# Patient Record
Sex: Female | Born: 1955
Health system: Southern US, Community
[De-identification: ages and names within clinical notes are randomized; demographics above are authoritative.]

## PROBLEM LIST (undated history)

## (undated) DIAGNOSIS — T4145XA Adverse effect of unspecified anesthetic, initial encounter: Secondary | ICD-10-CM

## (undated) DIAGNOSIS — Z87442 Personal history of urinary calculi: Secondary | ICD-10-CM

## (undated) DIAGNOSIS — E739 Lactose intolerance, unspecified: Secondary | ICD-10-CM

## (undated) DIAGNOSIS — T8859XA Other complications of anesthesia, initial encounter: Secondary | ICD-10-CM

## (undated) DIAGNOSIS — R030 Elevated blood-pressure reading, without diagnosis of hypertension: Secondary | ICD-10-CM

## (undated) HISTORY — DX: Lactose intolerance, unspecified: E73.9

## (undated) HISTORY — DX: Elevated blood-pressure reading, without diagnosis of hypertension: R03.0

---
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
## 2007-10-31 ENCOUNTER — Ambulatory Visit (HOSPITAL_COMMUNITY): Admission: RE | Admit: 2007-10-31 | Discharge: 2007-10-31 | Payer: Self-pay | Admitting: Family Medicine

## 2007-11-21 ENCOUNTER — Other Ambulatory Visit: Admission: RE | Admit: 2007-11-21 | Discharge: 2007-11-21 | Payer: Self-pay | Admitting: Obstetrics and Gynecology

## 2007-11-29 ENCOUNTER — Ambulatory Visit (HOSPITAL_COMMUNITY): Admission: RE | Admit: 2007-11-29 | Discharge: 2007-11-29 | Payer: Self-pay | Admitting: Obstetrics & Gynecology

## 2008-12-24 ENCOUNTER — Other Ambulatory Visit: Admission: RE | Admit: 2008-12-24 | Discharge: 2008-12-24 | Payer: Self-pay | Admitting: Obstetrics and Gynecology

## 2010-03-30 ENCOUNTER — Other Ambulatory Visit: Admission: RE | Admit: 2010-03-30 | Discharge: 2010-03-30 | Payer: Self-pay | Admitting: Obstetrics and Gynecology

## 2012-02-29 ENCOUNTER — Other Ambulatory Visit: Payer: Self-pay | Admitting: Adult Health

## 2012-02-29 ENCOUNTER — Other Ambulatory Visit (HOSPITAL_COMMUNITY)
Admission: RE | Admit: 2012-02-29 | Discharge: 2012-02-29 | Disposition: A | Discharge status: Home / Self Care | Payer: PRIVATE HEALTH INSURANCE | Source: Ambulatory Visit | Attending: Obstetrics and Gynecology | Admitting: Obstetrics and Gynecology

## 2012-02-29 DIAGNOSIS — Z1151 Encounter for screening for human papillomavirus (HPV): Secondary | ICD-10-CM | POA: Insufficient documentation

## 2012-02-29 DIAGNOSIS — Z01419 Encounter for gynecological examination (general) (routine) without abnormal findings: Secondary | ICD-10-CM | POA: Insufficient documentation

## 2013-03-19 ENCOUNTER — Ambulatory Visit (INDEPENDENT_AMBULATORY_CARE_PROVIDER_SITE_OTHER): Payer: PRIVATE HEALTH INSURANCE | Admitting: Adult Health

## 2013-03-19 ENCOUNTER — Encounter: Payer: Self-pay | Admitting: Adult Health

## 2013-03-19 VITALS — BP 130/90 | HR 80 | Ht 64.0 in | Wt 189.0 lb

## 2013-03-19 DIAGNOSIS — IMO0001 Reserved for inherently not codable concepts without codable children: Secondary | ICD-10-CM | POA: Insufficient documentation

## 2013-03-19 DIAGNOSIS — Z01419 Encounter for gynecological examination (general) (routine) without abnormal findings: Secondary | ICD-10-CM

## 2013-03-19 DIAGNOSIS — Z1212 Encounter for screening for malignant neoplasm of rectum: Secondary | ICD-10-CM

## 2013-03-19 HISTORY — DX: Reserved for inherently not codable concepts without codable children: IMO0001

## 2013-03-19 LAB — HEMOCCULT GUIAC POC 1CARD (OFFICE): Fecal Occult Blood, POC: NEGATIVE

## 2013-03-19 NOTE — Progress Notes (Signed)
Patient ID: Erica Davis, female   DOB: 1955/09/24, 57 y.o.   MRN: [id number] History of Present Illness: Erica Davis is a 57 year old white female married in for a physical, she has normal pap with negative HPV 02/29/12.She has had a sinus infection this year.Had normal labs earlier this year.   Current Medications, Allergies, Past Medical History, Past Surgical History, Family History and Social History were reviewed in [employer] record.     Review of Systems: Patient denies any headaches, blurred vision, shortness of breath, chest pain, abdominal pain, problems with bowel movements, urination, or intercourse.  No joint pain except in hands or mood swings.    Physical Exam:BP 130/90  Pulse 80  Ht 5\' 4"  (1.626 m)  Wt 189 lb (85.73 kg)  BMI 32.43 kg/m2 General:  Well developed, well nourished, no acute distress Skin:  Warm and dry Neck:  Midline trachea, normal thyroid Lungs; Clear to auscultation bilaterally Breast:  No dominant palpable mass, retraction, or nipple discharge Cardiovascular: Regular rate and rhythm Abdomen:  Soft, non tender, no hepatosplenomegaly Pelvic:  External genitalia is normal in appearance.  The vagina is normal in appearance. The cervix is smooth.  Uterus is felt to be normal size, shape, and contour.  No adnexal masses or tenderness noted. Rectal: Good sphincter tone, no polyps, or hemorrhoids felt.  Hemoccult negative. Extremities:  No swelling or varicosities noted Psych:  No mood changes, alert and cooperative   Impression: Yearly exam no pap Elevated BP    Plan: Physical in 1 year Mammogram yearly Advised colonoscopy  Decrease salt, try to lose 10 lbs, review handout on hypertension

## 2013-03-19 NOTE — Patient Instructions (Signed)

## 2014-03-18 ENCOUNTER — Encounter: Payer: Self-pay | Admitting: Adult Health

## 2014-03-21 ENCOUNTER — Encounter: Payer: Self-pay | Admitting: Adult Health

## 2014-03-21 ENCOUNTER — Ambulatory Visit (INDEPENDENT_AMBULATORY_CARE_PROVIDER_SITE_OTHER): Payer: PRIVATE HEALTH INSURANCE | Admitting: Adult Health

## 2014-03-21 VITALS — BP 120/80 | HR 80 | Ht 65.0 in | Wt 203.0 lb

## 2014-03-21 DIAGNOSIS — Z1212 Encounter for screening for malignant neoplasm of rectum: Secondary | ICD-10-CM

## 2014-03-21 DIAGNOSIS — Z01419 Encounter for gynecological examination (general) (routine) without abnormal findings: Secondary | ICD-10-CM

## 2014-03-21 LAB — HEMOCCULT GUIAC POC 1CARD (OFFICE): Fecal Occult Blood, POC: NEGATIVE

## 2014-03-21 NOTE — Patient Instructions (Signed)
Pap and physical in 1 year Mammogram yearly Colonoscopy advised  Labs with PCP Flu shot advised

## 2014-03-21 NOTE — Progress Notes (Signed)
Patient ID: Erica Davis, female   DOB: 1955-06-17, 58 y.o.   MRN: [id number] History of Present Illness:  Erica Davis is a 58 year old white female,maried, in for gyn physical.No complaints,she had a normal pap with negative HPV 02/29/12.  Current Medications, Allergies, Past Medical History, Past Surgical History, Family History and Social History were reviewed in [employer] record.     Review of Systems: patient denies any headaches, blurred vision, shortness of breath, chest pain, abdominal pain, problems with bowel movements, urination, or intercourse. No joint pain or mood swings.She had labs this year with PCP and all good.    Physical Exam:BP 120/80 mmHg  Pulse 80  Ht 5\' 5"  (1.651 m)  Wt 203 lb (92.08 kg)  BMI 33.78 kg/m2 General:  Well developed, well nourished, no acute distress Skin:  Warm and dry Neck:  Midline trachea, normal thyroid Lungs; Clear to auscultation bilaterally Breast:  No dominant palpable mass, retraction, or nipple discharge Cardiovascular: Regular rate and rhythm Abdomen:  Soft, non tender, no hepatosplenomegaly Pelvic:  External genitalia is normal in appearance for age,no lesions.  The vagina is normal in appearance.  The cervix is smooth.  Uterus is felt to be normal size, shape, and contour.  No                adnexal masses or tenderness noted. Rectal: Good sphincter tone, no polyps, or hemorrhoids felt.  Hemoccult negative. Extremities:  No swelling or varicosities noted Psych:  No mood changes,alert and cooperative,seems happy.   Impression:  Well woman gyn exam   Plan: Pap and physical in 1 year Mammogram yearly Colonoscopy advised Labs with PCP Advised to get flu shot

## 2014-10-23 ENCOUNTER — Other Ambulatory Visit: Payer: Self-pay | Admitting: Orthopedic Surgery

## 2014-10-23 DIAGNOSIS — M25561 Pain in right knee: Secondary | ICD-10-CM

## 2014-10-29 ENCOUNTER — Ambulatory Visit (HOSPITAL_COMMUNITY)
Admission: RE | Admit: 2014-10-29 | Discharge: 2014-10-29 | Disposition: A | Discharge status: Home / Self Care | Payer: PRIVATE HEALTH INSURANCE | Source: Ambulatory Visit | Attending: Orthopedic Surgery | Admitting: Orthopedic Surgery

## 2014-10-29 DIAGNOSIS — M25561 Pain in right knee: Secondary | ICD-10-CM | POA: Insufficient documentation

## 2014-11-05 ENCOUNTER — Encounter: Payer: Self-pay | Admitting: Orthopedic Surgery

## 2014-11-05 ENCOUNTER — Ambulatory Visit (INDEPENDENT_AMBULATORY_CARE_PROVIDER_SITE_OTHER): Payer: PRIVATE HEALTH INSURANCE | Admitting: Orthopedic Surgery

## 2014-11-05 VITALS — BP 136/88 | Ht 64.0 in | Wt 207.0 lb

## 2014-11-05 DIAGNOSIS — M129 Arthropathy, unspecified: Secondary | ICD-10-CM | POA: Diagnosis not present

## 2014-11-05 DIAGNOSIS — M1711 Unilateral primary osteoarthritis, right knee: Secondary | ICD-10-CM

## 2014-11-05 NOTE — Progress Notes (Signed)
Patient ID: Erica Davis, female   DOB: 10/29/1955, 59 y.o.   MRN: [id number] Patient ID: Erica Davis, female   DOB: 09-19-1955, 59 y.o.   MRN: [id number]  Chief Complaint  Patient presents with  . Knee Pain    Right knee pain, no injury. Referred by Dr. Wende Neighbors.    HPI [name] is a 59 y.o. female.  Presents with a three-month history of increasing pain in the right knee and the surrounding areas she says which includes the posterior and medial aspects of the knee. Most of her pain is in the patellofemoral joint is associated with climbing and ambulatory positions that involve steps. She took ice Advil and tried a topical cream but still has pain catching which is dull and aching and usually constant with an intensity of 5 out of 10. X-rays show moderate tibiofemoral arthritis and grade 4 patellofemoral arthritis. No other treatment  Review of systems ringing in the ears sinusitis seasonal allergy otherwise normal  Medical history of hypertension previous surgery cesarean section   Past Medical History  Diagnosis Date  . Elevated BP 03/19/2013    Past Surgical History  Procedure Laterality Date  . Cesarean section  1978     No Known Allergies  Current Outpatient Prescriptions  Medication Sig Dispense Refill  . cholecalciferol (VITAMIN D) 1000 UNITS tablet Take by mouth daily. Takes 3000 units daily    . Coenzyme Q10 (CO Q 10) 100 MG CAPS Take by mouth daily.    . Cyanocobalamin (VITAMIN B 12 PO) Take by mouth daily.    . fexofenadine (ALLEGRA) 180 MG tablet Take 90 mg by mouth daily.    . Multiple Vitamin (MULTIVITAMIN) tablet Take 1 tablet by mouth daily.    . Omega-3 Fatty Acids (FISH OIL PO) Take by mouth 2 (two) times daily.    . vitamin C (ASCORBIC ACID) 250 MG tablet Take 250 mg by mouth daily.    . vitamin E 400 UNIT capsule Take 400 Units by mouth 2 (two) times daily.     No current facility-administered medications for this visit.    Review of  Systems Review of Systems  Physical Exam Blood pressure 136/88, height 5\' 4"  (1.626 m), weight 207 lb (93.895 kg).  Physical Exam  BP 136/88 mmHg  Ht 5\' 4"  (1.626 m)  Wt 207 lb (93.895 kg)  BMI 35.51 kg/m2  She is awake alert and oriented 3 she is pleasant happy walks normally. Appearance is otherwise well groomed.  Medial lateral joint lines are nontender patellofemoral joint crepitance painful patellofemoral compression test knee flexion 100 so she's lost motion knee is stable motor exam is normal scans intact pulses are good sensation is normal  Data Reviewed the x-ray was done at the hospital was weightbearing shows tibiofemoral arthritis grade 3-2 and grade 4 patellofemoral arthritis with a very small patella   Assessment    Encounter Diagnosis  Name Primary?  [name] Arthritis of knee, right Yes        Plan    Injection  Procedure note right knee injection verbal consent was obtained to inject right knee joint  Timeout was completed to confirm the site of injection  The medications used were 40 mg of Depo-Medrol and 1% lidocaine 3 cc  Anesthesia was provided by ethyl chloride and the skin was prepped with alcohol.  After cleaning the skin with alcohol a 20-gauge needle was used to inject the right knee joint. There were no complications. A  sterile bandage was applied.     Arther Abbott 11/05/2014, 2:39 PM

## 2014-11-05 NOTE — Patient Instructions (Signed)
Joint Injection  Care After  Refer to this sheet in the next few days. These instructions provide you with information on caring for yourself after you have had a joint injection. Your caregiver also may give you more specific instructions. Your treatment has been planned according to current medical practices, but problems sometimes occur. Call your caregiver if you have any problems or questions after your procedure.  After any type of joint injection, it is not uncommon to experience:  · Soreness, swelling, or bruising around the injection site.  · Mild numbness, tingling, or weakness around the injection site caused by the numbing medicine used before or with the injection.  It also is possible to experience the following effects associated with the specific agent after injection:  · Iodine-based contrast agents:  ¨ Allergic reaction (itching, hives, widespread redness, and swelling beyond the injection site).  · Corticosteroids (These effects are rare.):  ¨ Allergic reaction.  ¨ Increased blood sugar levels (If you have diabetes and you notice that your blood sugar levels have increased, notify your caregiver).  ¨ Increased blood pressure levels.  ¨ Mood swings.  · Hyaluronic acid in the use of viscosupplementation.  ¨ Temporary heat or redness.  ¨ Temporary rash and itching.  ¨ Increased fluid accumulation in the injected joint.  These effects all should resolve within a day after your procedure.   HOME CARE INSTRUCTIONS  · Limit yourself to light activity the day of your procedure. Avoid lifting heavy objects, bending, stooping, or twisting.  · Take prescription or over-the-counter pain medication as directed by your caregiver.  · You may apply ice to your injection site to reduce pain and swelling the day of your procedure. Ice may be applied 03-04 times:  ¨ Put ice in a plastic bag.  ¨ Place a towel between your skin and the bag.  ¨ Leave the ice on for no longer than 15-20 minutes each time.  SEEK  IMMEDIATE MEDICAL CARE IF:   · Pain and swelling get worse rather than better or extend beyond the injection site.  · Numbness does not go away.  · Blood or fluid continues to leak from the injection site.  · You have chest pain.  · You have swelling of your face or tongue.  · You have trouble breathing or you become dizzy.  · You develop a fever, chills, or severe tenderness at the injection site that last longer than 1 day.  MAKE SURE YOU:  · Understand these instructions.  · Watch your condition.  · Get help right away if you are not doing well or if you get worse.  Document Released: 01/14/2011 Document Revised: 07/26/2011 Document Reviewed: 01/14/2011  ExitCare® Patient Information ©2015 [employer]. This information is not intended to replace advice given to you by your health care provider. Make sure you discuss any questions you have with your health care provider.

## 2014-11-12 ENCOUNTER — Telehealth: Payer: Self-pay | Admitting: Orthopedic Surgery

## 2014-11-12 NOTE — Telephone Encounter (Signed)
Patient is calling an has decided to go ahead with a total knee replacement of Rt knee as discussed during ov on 11/05/14 and she is asking what is her next step to get this done, please advise?

## 2014-11-12 NOTE — Telephone Encounter (Signed)
I don't have a record of this so I'll have to call her

## 2014-11-15 ENCOUNTER — Telehealth: Payer: Self-pay | Admitting: Orthopedic Surgery

## 2014-11-15 NOTE — Telephone Encounter (Signed)
Left message to call me Tuesday or Wednesday of next week July 5 are 6 to discuss her knee replacement surgery and let her know about the risks and complications

## 2014-11-20 ENCOUNTER — Telehealth: Payer: Self-pay | Admitting: Orthopedic Surgery

## 2014-11-20 NOTE — Telephone Encounter (Signed)
Patient called stating she is returning Dr. Althia Forts phone call from over the weekend, patient seems to think she should be scheduling surgery, please advise?

## 2014-11-21 ENCOUNTER — Telehealth: Payer: Self-pay | Admitting: Orthopedic Surgery

## 2014-11-21 NOTE — Telephone Encounter (Signed)
This was discussed  You have decided to proceed with knee replacement surgery. You have decided not to continue with nonoperative measures such as but not limited to oral medication, weight loss, activity modification, physical therapy, bracing, or injection.  We will perform the procedure commonly known as total knee replacement. Some of the risks associated with knee replacement surgery include but are not limited to Bleeding Infection Swelling Stiffness Blood clot Pain that persists even after surgery  Infection is especially devastating complication of knee surgery although rare. If infection does occur your implant will usually have to be removed and several surgeries will be needed to eradicate the infection prior to performing a repeat replacement.

## 2014-11-25 ENCOUNTER — Other Ambulatory Visit: Payer: Self-pay | Admitting: *Deleted

## 2014-12-11 ENCOUNTER — Telehealth: Payer: Self-pay | Admitting: *Deleted

## 2014-12-11 NOTE — Telephone Encounter (Signed)
REFERRAL FAXED TO [facility] S/P KNEE REPLACEMENT 8/17  REFERRAL FAXED TO [facility] S/P KNEE REPLACMENT 8/17

## 2014-12-12 ENCOUNTER — Telehealth: Payer: Self-pay | Admitting: Orthopedic Surgery

## 2014-12-12 NOTE — Telephone Encounter (Signed)
Regarding in-patient/admit surgery scheduled 01/01/15, contacted insurer [employer], ph# [phone]; per Sandrea Hammond, received Authorization# [id number], effective for 01/01/15 through 01/05/15, received 12/12/14, 11:58AM

## 2014-12-16 ENCOUNTER — Telehealth: Payer: Self-pay | Admitting: Orthopedic Surgery

## 2014-12-16 NOTE — Telephone Encounter (Signed)
Erica Davis with [facility] called stating that he needs medical records on [place] ASAP faxed to [phone], please advise?

## 2014-12-16 NOTE — Telephone Encounter (Signed)
Notes faxed as requested.

## 2014-12-24 NOTE — Patient Instructions (Signed)
Erica Davis  12/24/2014     @PREFPERIOPPHARMACY @   Your procedure is scheduled on  01/01/2015  Report to [facility] at  615  A.M.  Call this number if you have problems the morning of surgery:  [phone]   Remember:  Do not eat food or drink liquids after midnight.  Take these medicines the morning of surgery with A SIP OF WATER  none   Do not wear jewelry, make-up or nail polish.  Do not wear lotions, powders, or perfumes.   Do not shave 48 hours prior to surgery.  Men may shave face and neck.  Do not bring valuables to the hospital.  [facility] is not responsible for any belongings or valuables.  Contacts, dentures or bridgework may not be worn into surgery.  Leave your suitcase in the car.  After surgery it may be brought to your room.  For patients admitted to the hospital, discharge time will be determined by your treatment team.  Patients discharged the day of surgery will not be allowed to drive home.   Name and phone number of your driver:   family Special instructions: None  Please read over the following fact sheets that you were given. Pain Booklet, Coughing and Deep Breathing, Blood Transfusion Information, Total Joint Packet, MRSA Information, Surgical Site Infection Prevention, Anesthesia Post-op Instructions and Care and Recovery After Surgery      Total Knee Replacement Total knee replacement is a procedure to replace your knee joint with an artificial knee joint (prosthetic knee joint). The purpose of this surgery is to reduce pain and improve your knee function. LET [facility] CARE PROVIDER KNOW ABOUT:   Any allergies you have.  All medicines you are taking, including vitamins, herbs, eye drops, creams, and over-the-counter medicines.  Previous problems you or members of your family have had with the use of anesthetics.  Any blood disorders you have.  Previous surgeries you have had.  Medical conditions you have. RISKS AND  COMPLICATIONS  Generally, total knee replacement is a safe procedure. However, problems can occur and include:  Loss of range of motion of the knee or instability.  Loosening of the prosthesis.  Infection.  Persistent pain. BEFORE THE PROCEDURE   Do not eat or drink anything after midnight on the night before the procedure or as directed by your health care provider.  Ask your health care provider about changing or stopping your regular medicines. This is especially important if you are taking diabetes medicines or blood thinners. PROCEDURE   Just before the procedure, you will receive medicine that will make you drowsy (sedative). This will be given through a tube that is inserted into one of your veins (IV tube).  Then you will be given one of the following:  A medicine injected into your spine that numbs your body below the waist (spinal anesthetic).  A medicine that makes you fall asleep (general anesthetic).  You may also receive medicine to block feeling in your leg (nerve block) to help ease pain after surgery.  An incision will be made in your knee. Your surgeon will take out any damaged cartilage and bone by sawing off the damaged surfaces.  The surgeon will then put a new metal liner over the sawed-off portion of your thigh bone (femur) and a plastic liner over the sawed-off portion of one of the bones of your lower leg (tibia). This is to restore alignment and function to  your knee. A plastic piece is often used to restore the surface of your knee cap. AFTER THE PROCEDURE   You will be taken to the recovery area.  You may have drainage tubes to drain excess fluid from your knee. These tubes attach to a device that removes these fluids.  Once you are awake, stable, and taking fluids well, you will be taken to your hospital room.  You will receive physical therapy as prescribed by your health care provider.  Your surgeon may recommend that you spend time (usually an  additional 10-14 days) in an extended-care facility to help you begin walking again and improve your range of motion before you go home.  You may also be prescribed blood-thinning medicine to decrease your risk of developing blood clots in your leg. Document Released: 08/09/2000 Document Revised: 09/17/2013 Document Reviewed: 06/13/2011 [facility] Patient Information 2015 [place], [place]. This information is not intended to replace advice given to you by your health care provider. Make sure you discuss any questions you have with your health care provider. PATIENT INSTRUCTIONS POST-ANESTHESIA  IMMEDIATELY FOLLOWING SURGERY:  Do not drive or operate machinery for the first twenty four hours after surgery.  Do not make any important decisions for twenty four hours after surgery or while taking narcotic pain medications or sedatives.  If you develop intractable nausea and vomiting or a severe headache please notify your doctor immediately.  FOLLOW-UP:  Please make an appointment with your surgeon as instructed. You do not need to follow up with anesthesia unless specifically instructed to do so.  WOUND CARE INSTRUCTIONS (if applicable):  Keep a dry clean dressing on the anesthesia/puncture wound site if there is drainage.  Once the wound has quit draining you may leave it open to air.  Generally you should leave the bandage intact for twenty four hours unless there is drainage.  If the epidural site drains for more than 36-48 hours please call the anesthesia department.  QUESTIONS?:  Please feel free to call your physician or the hospital operator if you have any questions, and they will be happy to assist you.

## 2014-12-27 ENCOUNTER — Other Ambulatory Visit: Payer: Self-pay

## 2014-12-27 ENCOUNTER — Encounter (HOSPITAL_COMMUNITY): Payer: Self-pay

## 2014-12-27 ENCOUNTER — Encounter (HOSPITAL_COMMUNITY)
Admission: RE | Admit: 2014-12-27 | Discharge: 2014-12-27 | Disposition: A | Discharge status: Home / Self Care | Payer: PRIVATE HEALTH INSURANCE | Source: Ambulatory Visit | Attending: Orthopedic Surgery | Admitting: Orthopedic Surgery

## 2014-12-27 DIAGNOSIS — Z01818 Encounter for other preprocedural examination: Secondary | ICD-10-CM | POA: Diagnosis not present

## 2014-12-27 DIAGNOSIS — M179 Osteoarthritis of knee, unspecified: Secondary | ICD-10-CM | POA: Diagnosis not present

## 2014-12-27 LAB — BASIC METABOLIC PANEL
Anion gap: 9 (ref 5–15)
BUN: 11 mg/dL (ref 6–20)
CO2: 24 mmol/L (ref 22–32)
Calcium: 9.5 mg/dL (ref 8.9–10.3)
Chloride: 106 mmol/L (ref 101–111)
Creatinine, Ser: 0.71 mg/dL (ref 0.44–1.00)
GFR calc Af Amer: >60 mL/min (ref >60)
GFR calc non Af Amer: >60 mL/min (ref >60)
Glucose, Bld: 90 mg/dL (ref 65–99)
Potassium: 4 mmol/L (ref 3.5–5.1)
Sodium: 139 mmol/L (ref 135–145)

## 2014-12-27 LAB — CBC WITH DIFFERENTIAL/PLATELET
Basophils Absolute: 0 10*3/uL (ref 0.0–0.1)
Basophils Relative: 1 % (ref 0–1)
Eosinophils Absolute: 0.2 10*3/uL (ref 0.0–0.7)
Eosinophils Relative: 4 % (ref 0–5)
HCT: 43.7 % (ref 36.0–46.0)
Hemoglobin: 14.6 g/dL (ref 12.0–15.0)
Lymphocytes Relative: 30 % (ref 12–46)
Lymphs Abs: 1.7 10*3/uL (ref 0.7–4.0)
MCH: 31.5 pg (ref 26.0–34.0)
MCHC: 33.4 g/dL (ref 30.0–36.0)
MCV: 94.4 fL (ref 78.0–100.0)
Monocytes Absolute: 0.4 10*3/uL (ref 0.1–1.0)
Monocytes Relative: 7 % (ref 3–12)
Neutro Abs: 3.3 10*3/uL (ref 1.7–7.7)
Neutrophils Relative %: 58 % (ref 43–77)
Platelets: 210 10*3/uL (ref 150–400)
RBC: 4.63 MIL/uL (ref 3.87–5.11)
RDW: 13.3 % (ref 11.5–15.5)
WBC: 5.8 10*3/uL (ref 4.0–10.5)

## 2014-12-27 LAB — PROTIME-INR
INR: 0.96 (ref 0.00–1.49)
Prothrombin Time: 13 seconds (ref 11.6–15.2)

## 2014-12-27 LAB — SURGICAL PCR SCREEN
MRSA, PCR: NEGATIVE
Staphylococcus aureus: NEGATIVE

## 2014-12-27 LAB — APTT: aPTT: 29 seconds (ref 24–37)

## 2014-12-27 LAB — ABO/RH: ABO/RH(D): A POS

## 2014-12-31 LAB — PREPARE RBC (CROSSMATCH)

## 2014-12-31 MED ORDER — TRANEXAMIC ACID 1000 MG/10ML IV SOLN
1000.0000 mg | INTRAVENOUS | Status: AC
  Administered 2015-01-01: 1000 mg via INTRAVENOUS
  Filled 2014-12-31: qty 10

## 2014-12-31 NOTE — H&P (Signed)
TOTAL KNEE ADMISSION H&P  Patient is being admitted for right total knee arthroplasty.  Subjective:  Chief Complaint:right knee pain.  HPI: Erica Davis, 59 y.o. female, has a history of pain and functional disability in the right knee due to arthritis and has failed non-surgical conservative treatments for greater than 12 weeks to includeNSAID's and/or analgesics, corticosteriod injections and activity modification.  Onset of symptoms was gradual, starting many months to years ago years ago with gradually worsening course since that time. The patient noted no past surgery on the right knee(s).  Patient currently rates pain in the right knee(s) at 7 out of 10 with activity. Patient has worsening of pain with activity and weight bearing, pain that interferes with activities of daily living, pain with passive range of motion and crepitus.  Patient has evidence of subchondral sclerosis, periarticular osteophytes and joint space narrowing by imaging studies. There is no active infection.  Patient Active Problem List   Diagnosis Date Noted  . Elevated BP 03/19/2013   Past Medical History  Diagnosis Date  . Elevated BP 03/19/2013    Past Surgical History  Procedure Laterality Date  . Cesarean section  1978    No prescriptions prior to admission   No Known Allergies  Social History  Substance Use Topics  . Smoking status: Never Smoker   . Smokeless tobacco: Never Used  . Alcohol Use: No    Family History  Problem Relation Age of Onset  . Macular degeneration Father   . Cancer Maternal Aunt     breast  . Cancer Mother     ovarian     ROS  Objective:  Physical Exam Physical Exam  BP 136/88 mmHg  Ht 5\' 4"  (1.626 m)  Wt 207 lb (93.895 kg)  BMI 35.51 kg/m2  She is awake alert and oriented 3  she is pleasant happy walks normally.  Appearance is otherwise well groomed.   Medial lateral joint lines are nontender patellofemoral joint crepitance painful patellofemoral  compression test knee flexion 100 so she's lost motion knee is stable motor exam is normal scans intact pulses are good sensation is normal   Left knee is normal  Upper extremities range of motion stability and strength is normal appearance and alignment normal   Data Reviewed the x-ray was done at the hospital was weightbearing shows tibiofemoral arthritis grade 3-2 and grade 4 patellofemoral arthritis with a very small patella   Vital signs in last 24 hours:    Labs:   Estimated body mass index is 35.51 kg/(m^2) as calculated from the following:   Height as of 11/05/14: 5\' 4"  (1.626 m).   Weight as of 11/05/14: 207 lb (93.895 kg).   Imaging Review Plain radiographs demonstrate severe degenerative joint disease of the Patellofemoral joint with mild to moderate tibiofemoral arthritis in the right knee(s). The overall alignment ismild valgus. The bone quality appears to be good for age and reported activity level.  Assessment/Plan:  End stage arthritis, right knee   The patient history, physical examination, clinical judgment of the provider and imaging studies are consistent with end stage degenerative joint disease of the right knee(s) and total knee arthroplasty is deemed medically necessary. The treatment options including medical management, injection therapy arthroscopy and arthroplasty were discussed at length. The risks and benefits of total knee arthroplasty were presented and reviewed. The risks due to aseptic loosening, infection, stiffness, patella tracking problems, thromboembolic complications and other imponderables were discussed. The patient acknowledged the explanation, agreed to  proceed with the plan and consent was signed. Patient is being admitted for inpatient treatment for surgery, pain control, PT, OT, prophylactic antibiotics, VTE prophylaxis, progressive ambulation and ADL's and discharge planning. The patient is planning to be discharged home with home health  services

## 2015-01-01 ENCOUNTER — Inpatient Hospital Stay (HOSPITAL_COMMUNITY)
Admission: RE | Admit: 2015-01-01 | Discharge: 2015-01-03 | DRG: 470 | Disposition: A | Discharge status: Home Health | Payer: PRIVATE HEALTH INSURANCE | Source: Ambulatory Visit | Attending: Orthopedic Surgery | Admitting: Orthopedic Surgery

## 2015-01-01 ENCOUNTER — Encounter (HOSPITAL_COMMUNITY)
Admission: RE | Disposition: A | Discharge status: Home Health | Payer: Self-pay | Source: Ambulatory Visit | Attending: Orthopedic Surgery

## 2015-01-01 ENCOUNTER — Encounter (HOSPITAL_COMMUNITY): Payer: Self-pay | Admitting: *Deleted

## 2015-01-01 ENCOUNTER — Inpatient Hospital Stay (HOSPITAL_COMMUNITY): Payer: PRIVATE HEALTH INSURANCE | Admitting: Anesthesiology

## 2015-01-01 ENCOUNTER — Inpatient Hospital Stay (HOSPITAL_COMMUNITY): Payer: PRIVATE HEALTH INSURANCE

## 2015-01-01 DIAGNOSIS — M1711 Unilateral primary osteoarthritis, right knee: Secondary | ICD-10-CM | POA: Diagnosis present

## 2015-01-01 DIAGNOSIS — I10 Essential (primary) hypertension: Secondary | ICD-10-CM | POA: Diagnosis present

## 2015-01-01 DIAGNOSIS — M25561 Pain in right knee: Secondary | ICD-10-CM | POA: Diagnosis present

## 2015-01-01 DIAGNOSIS — Z09 Encounter for follow-up examination after completed treatment for conditions other than malignant neoplasm: Secondary | ICD-10-CM

## 2015-01-01 HISTORY — PX: TOTAL KNEE ARTHROPLASTY: SHX125

## 2015-01-01 SURGERY — ARTHROPLASTY, KNEE, TOTAL
Anesthesia: Spinal | Site: Knee | Laterality: Right

## 2015-01-01 MED ORDER — VITAMIN E 180 MG (400 UNIT) PO CAPS
400.0000 [IU] | ORAL_CAPSULE | Freq: Two times a day (BID) | ORAL | Status: DC
  Administered 2015-01-01 – 2015-01-03 (×3): 400 [IU] via ORAL
  Filled 2015-01-01 (×11): qty 1

## 2015-01-01 MED ORDER — PROPOFOL 10 MG/ML IV BOLUS
INTRAVENOUS | Status: AC
  Filled 2015-01-01: qty 20

## 2015-01-01 MED ORDER — ASPIRIN EC 325 MG PO TBEC
325.0000 mg | DELAYED_RELEASE_TABLET | Freq: Two times a day (BID) | ORAL | Status: DC
  Administered 2015-01-01 – 2015-01-03 (×5): 325 mg via ORAL
  Filled 2015-01-01 (×5): qty 1

## 2015-01-01 MED ORDER — ONDANSETRON HCL 4 MG/2ML IJ SOLN
4.0000 mg | Freq: Four times a day (QID) | INTRAMUSCULAR | Status: DC | PRN
  Administered 2015-01-02: 4 mg via INTRAVENOUS
  Filled 2015-01-01: qty 2

## 2015-01-01 MED ORDER — SODIUM CHLORIDE 0.9 % IJ SOLN
INTRAMUSCULAR | Status: AC
  Filled 2015-01-01: qty 40

## 2015-01-01 MED ORDER — CEFAZOLIN SODIUM-DEXTROSE 2-3 GM-% IV SOLR
2.0000 g | Freq: Four times a day (QID) | INTRAVENOUS | Status: AC
  Administered 2015-01-01 (×2): 2 g via INTRAVENOUS
  Filled 2015-01-01 (×2): qty 50

## 2015-01-01 MED ORDER — VITAMIN D 1000 UNITS PO TABS
3000.0000 [IU] | ORAL_TABLET | Freq: Every day | ORAL | Status: DC
  Administered 2015-01-01 – 2015-01-03 (×2): 3000 [IU] via ORAL
  Filled 2015-01-01 (×3): qty 3

## 2015-01-01 MED ORDER — DIPHENHYDRAMINE HCL 12.5 MG/5ML PO ELIX: 12.5000 mg | ORAL_SOLUTION | ORAL | Status: DC | PRN

## 2015-01-01 MED ORDER — MAGNESIUM CITRATE PO SOLN: 1.0000 | Freq: Once | ORAL | Status: DC | PRN

## 2015-01-01 MED ORDER — SODIUM CHLORIDE 0.9 % IJ SOLN
INTRAMUSCULAR | Status: AC
  Filled 2015-01-01: qty 10

## 2015-01-01 MED ORDER — KETOROLAC TROMETHAMINE 15 MG/ML IJ SOLN
15.0000 mg | Freq: Four times a day (QID) | INTRAMUSCULAR | Status: DC
  Administered 2015-01-02 – 2015-01-03 (×7): 15 mg via INTRAVENOUS
  Filled 2015-01-01 (×7): qty 1

## 2015-01-01 MED ORDER — OMEGA-3-ACID ETHYL ESTERS 1 G PO CAPS
1.0000 g | ORAL_CAPSULE | Freq: Two times a day (BID) | ORAL | Status: DC
  Administered 2015-01-01 – 2015-01-03 (×3): 1 g via ORAL
  Filled 2015-01-01 (×5): qty 1

## 2015-01-01 MED ORDER — BUPIVACAINE-EPINEPHRINE (PF) 0.5% -1:200000 IJ SOLN
INTRAMUSCULAR | Status: DC | PRN
  Administered 2015-01-01: 30 mL

## 2015-01-01 MED ORDER — SODIUM CHLORIDE 0.9 % IR SOLN
Status: DC | PRN
  Administered 2015-01-01: 3000 mL
  Administered 2015-01-01: 1000 mL

## 2015-01-01 MED ORDER — BUPIVACAINE-EPINEPHRINE (PF) 0.5% -1:200000 IJ SOLN
INTRAMUSCULAR | Status: AC
  Filled 2015-01-01: qty 30

## 2015-01-01 MED ORDER — EPHEDRINE SULFATE 50 MG/ML IJ SOLN
INTRAMUSCULAR | Status: AC
  Filled 2015-01-01: qty 1

## 2015-01-01 MED ORDER — ONDANSETRON HCL 4 MG/2ML IJ SOLN: 4.0000 mg | Freq: Once | INTRAMUSCULAR | Status: DC | PRN

## 2015-01-01 MED ORDER — CEFAZOLIN SODIUM-DEXTROSE 2-3 GM-% IV SOLR
2.0000 g | INTRAVENOUS | Status: AC
  Administered 2015-01-01: 2 g via INTRAVENOUS
  Filled 2015-01-01: qty 50

## 2015-01-01 MED ORDER — PHENOL 1.4 % MT LIQD: 1.0000 | OROMUCOSAL | Status: DC | PRN

## 2015-01-01 MED ORDER — BISACODYL 10 MG RE SUPP: 10.0000 mg | Freq: Every day | RECTAL | Status: DC | PRN

## 2015-01-01 MED ORDER — BUPIVACAINE IN DEXTROSE 0.75-8.25 % IT SOLN
INTRATHECAL | Status: DC | PRN
  Administered 2015-01-01 (×2): 15 mg via INTRATHECAL

## 2015-01-01 MED ORDER — ONDANSETRON HCL 4 MG/2ML IJ SOLN
4.0000 mg | Freq: Once | INTRAMUSCULAR | Status: AC
  Administered 2015-01-01: 4 mg via INTRAVENOUS
  Filled 2015-01-01: qty 2

## 2015-01-01 MED ORDER — OXYCODONE HCL 5 MG PO TABS
5.0000 mg | ORAL_TABLET | Freq: Once | ORAL | Status: AC
  Administered 2015-01-01: 5 mg via ORAL
  Filled 2015-01-01: qty 1

## 2015-01-01 MED ORDER — CHLORHEXIDINE GLUCONATE 4 % EX LIQD: 60.0000 mL | Freq: Once | CUTANEOUS | Status: DC

## 2015-01-01 MED ORDER — PROPOFOL INFUSION 10 MG/ML OPTIME
INTRAVENOUS | Status: DC | PRN
  Administered 2015-01-01: 25 ug/kg/min via INTRAVENOUS
  Administered 2015-01-01: 09:00:00 via INTRAVENOUS

## 2015-01-01 MED ORDER — RISAQUAD PO CAPS
1.0000 | ORAL_CAPSULE | Freq: Every day | ORAL | Status: DC
  Administered 2015-01-01 – 2015-01-03 (×3): 1 via ORAL
  Filled 2015-01-01 (×3): qty 1

## 2015-01-01 MED ORDER — FENTANYL CITRATE (PF) 100 MCG/2ML IJ SOLN
INTRAMUSCULAR | Status: DC | PRN
  Administered 2015-01-01: 25 ug via INTRAVENOUS
  Administered 2015-01-01: 25 ug via INTRATHECAL
  Administered 2015-01-01: 25 ug via INTRAVENOUS

## 2015-01-01 MED ORDER — LORATADINE 10 MG PO TABS
10.0000 mg | ORAL_TABLET | Freq: Every day | ORAL | Status: DC
  Administered 2015-01-01 – 2015-01-03 (×2): 10 mg via ORAL
  Filled 2015-01-01 (×3): qty 1

## 2015-01-01 MED ORDER — LIDOCAINE HCL (PF) 1 % IJ SOLN
INTRAMUSCULAR | Status: AC
  Filled 2015-01-01: qty 5

## 2015-01-01 MED ORDER — MIDAZOLAM HCL 2 MG/2ML IJ SOLN
INTRAMUSCULAR | Status: AC
  Filled 2015-01-01: qty 4

## 2015-01-01 MED ORDER — FENTANYL CITRATE (PF) 100 MCG/2ML IJ SOLN
INTRAMUSCULAR | Status: AC
  Filled 2015-01-01: qty 4

## 2015-01-01 MED ORDER — LACTATED RINGERS IV SOLN
INTRAVENOUS | Status: DC
  Administered 2015-01-01 (×2): via INTRAVENOUS

## 2015-01-01 MED ORDER — DEXAMETHASONE SODIUM PHOSPHATE 4 MG/ML IJ SOLN
4.0000 mg | Freq: Once | INTRAMUSCULAR | Status: AC
  Administered 2015-01-01: 4 mg via INTRAVENOUS

## 2015-01-01 MED ORDER — HYDROMORPHONE HCL 1 MG/ML IJ SOLN
0.5000 mg | INTRAMUSCULAR | Status: DC | PRN
  Filled 2015-01-01: qty 1

## 2015-01-01 MED ORDER — METHOCARBAMOL 1000 MG/10ML IJ SOLN
500.0000 mg | Freq: Four times a day (QID) | INTRAMUSCULAR | Status: DC | PRN
  Filled 2015-01-01: qty 5

## 2015-01-01 MED ORDER — CO Q 10 100 MG PO CAPS: ORAL_CAPSULE | Freq: Every day | ORAL | Status: DC

## 2015-01-01 MED ORDER — BUPIVACAINE LIPOSOME 1.3 % IJ SUSP
INTRAMUSCULAR | Status: AC
  Filled 2015-01-01: qty 20

## 2015-01-01 MED ORDER — CELECOXIB 100 MG PO CAPS
200.0000 mg | ORAL_CAPSULE | Freq: Two times a day (BID) | ORAL | Status: DC
  Administered 2015-01-01 – 2015-01-03 (×5): 200 mg via ORAL
  Filled 2015-01-01 (×5): qty 2

## 2015-01-01 MED ORDER — MIDAZOLAM HCL 2 MG/2ML IJ SOLN
1.0000 mg | INTRAMUSCULAR | Status: DC | PRN
  Administered 2015-01-01: 2 mg via INTRAVENOUS

## 2015-01-01 MED ORDER — SODIUM CHLORIDE 0.9 % IV SOLN
INTRAVENOUS | Status: DC
Start: 2015-01-01 — End: 2015-01-02
  Administered 2015-01-01 – 2015-01-02 (×2): via INTRAVENOUS

## 2015-01-01 MED ORDER — DEXAMETHASONE SODIUM PHOSPHATE 4 MG/ML IJ SOLN
INTRAMUSCULAR | Status: AC
  Filled 2015-01-01: qty 1

## 2015-01-01 MED ORDER — POLYETHYLENE GLYCOL 3350 17 G PO PACK
17.0000 g | PACK | Freq: Every day | ORAL | Status: DC
  Administered 2015-01-01: 17 g via ORAL
  Filled 2015-01-01 (×3): qty 1

## 2015-01-01 MED ORDER — METHOCARBAMOL 1000 MG/10ML IJ SOLN
500.0000 mg | Freq: Once | INTRAVENOUS | Status: AC
  Administered 2015-01-01: 500 mg via INTRAVENOUS
  Filled 2015-01-01: qty 5

## 2015-01-01 MED ORDER — ADULT MULTIVITAMIN W/MINERALS CH
1.0000 | ORAL_TABLET | Freq: Every day | ORAL | Status: DC
  Administered 2015-01-01 – 2015-01-03 (×2): 1 via ORAL
  Filled 2015-01-01 (×3): qty 1

## 2015-01-01 MED ORDER — EPHEDRINE SULFATE 50 MG/ML IJ SOLN
INTRAMUSCULAR | Status: DC | PRN
  Administered 2015-01-01 (×2): 5 mg via INTRAVENOUS

## 2015-01-01 MED ORDER — VITAMIN C 500 MG PO TABS
250.0000 mg | ORAL_TABLET | Freq: Every day | ORAL | Status: DC
  Administered 2015-01-01 – 2015-01-03 (×2): 250 mg via ORAL
  Filled 2015-01-01 (×3): qty 1

## 2015-01-01 MED ORDER — BUPIVACAINE IN DEXTROSE 0.75-8.25 % IT SOLN
INTRATHECAL | Status: AC
  Filled 2015-01-01: qty 4

## 2015-01-01 MED ORDER — ACETAMINOPHEN 325 MG PO TABS
650.0000 mg | ORAL_TABLET | Freq: Four times a day (QID) | ORAL | Status: DC | PRN
  Administered 2015-01-01: 650 mg via ORAL
  Filled 2015-01-01: qty 2

## 2015-01-01 MED ORDER — ACETAMINOPHEN 650 MG RE SUPP: 650.0000 mg | Freq: Four times a day (QID) | RECTAL | Status: DC | PRN

## 2015-01-01 MED ORDER — ONDANSETRON HCL 4 MG/2ML IJ SOLN
4.0000 mg | Freq: Once | INTRAMUSCULAR | Status: AC
  Administered 2015-01-01: 4 mg via INTRAVENOUS

## 2015-01-01 MED ORDER — PROPOFOL 10 MG/ML IV BOLUS
INTRAVENOUS | Status: DC | PRN
  Administered 2015-01-01: 10 mg via INTRAVENOUS

## 2015-01-01 MED ORDER — FENTANYL CITRATE (PF) 100 MCG/2ML IJ SOLN
INTRAMUSCULAR | Status: AC
  Filled 2015-01-01: qty 2

## 2015-01-01 MED ORDER — FENTANYL CITRATE (PF) 100 MCG/2ML IJ SOLN
25.0000 ug | INTRAMUSCULAR | Status: AC
  Administered 2015-01-01 (×2): 25 ug via INTRAVENOUS

## 2015-01-01 MED ORDER — METOCLOPRAMIDE HCL 10 MG PO TABS: 5.0000 mg | ORAL_TABLET | Freq: Three times a day (TID) | ORAL | Status: DC | PRN

## 2015-01-01 MED ORDER — PREGABALIN 50 MG PO CAPS
50.0000 mg | ORAL_CAPSULE | Freq: Once | ORAL | Status: AC
  Administered 2015-01-01: 50 mg via ORAL
  Filled 2015-01-01: qty 1

## 2015-01-01 MED ORDER — MIDAZOLAM HCL 2 MG/2ML IJ SOLN
INTRAMUSCULAR | Status: AC
  Filled 2015-01-01: qty 2

## 2015-01-01 MED ORDER — SODIUM CHLORIDE 0.9 % IV SOLN
INTRAVENOUS | Status: DC | PRN
  Administered 2015-01-01: 60 mL

## 2015-01-01 MED ORDER — CELECOXIB 400 MG PO CAPS
400.0000 mg | ORAL_CAPSULE | Freq: Once | ORAL | Status: AC
  Administered 2015-01-01: 400 mg via ORAL
  Filled 2015-01-01: qty 1

## 2015-01-01 MED ORDER — HYDROCODONE-ACETAMINOPHEN 7.5-325 MG PO TABS
1.0000 | ORAL_TABLET | ORAL | Status: DC
  Administered 2015-01-01 – 2015-01-02 (×4): 1 via ORAL
  Filled 2015-01-01 (×4): qty 1

## 2015-01-01 MED ORDER — ONDANSETRON HCL 4 MG/2ML IJ SOLN
INTRAMUSCULAR | Status: AC
Start: 2015-01-01 — End: 2015-01-01
  Filled 2015-01-01: qty 2

## 2015-01-01 MED ORDER — METHOCARBAMOL 500 MG PO TABS
500.0000 mg | ORAL_TABLET | Freq: Four times a day (QID) | ORAL | Status: DC | PRN
Start: 2015-01-01 — End: 2015-01-03

## 2015-01-01 MED ORDER — ALUM & MAG HYDROXIDE-SIMETH 200-200-20 MG/5ML PO SUSP: 30.0000 mL | ORAL | Status: DC | PRN

## 2015-01-01 MED ORDER — SENNA 8.6 MG PO TABS
1.0000 | ORAL_TABLET | Freq: Two times a day (BID) | ORAL | Status: DC
  Administered 2015-01-01 – 2015-01-03 (×3): 8.6 mg via ORAL
  Filled 2015-01-01 (×5): qty 1

## 2015-01-01 MED ORDER — FENTANYL CITRATE (PF) 100 MCG/2ML IJ SOLN: 25.0000 ug | INTRAMUSCULAR | Status: DC | PRN

## 2015-01-01 MED ORDER — MIDAZOLAM HCL 5 MG/5ML IJ SOLN
INTRAMUSCULAR | Status: DC | PRN
  Administered 2015-01-01: 1 mg via INTRAVENOUS

## 2015-01-01 MED ORDER — METOCLOPRAMIDE HCL 5 MG/ML IJ SOLN: 5.0000 mg | Freq: Three times a day (TID) | INTRAMUSCULAR | Status: DC | PRN

## 2015-01-01 MED ORDER — ONDANSETRON HCL 4 MG PO TABS: 4.0000 mg | ORAL_TABLET | Freq: Four times a day (QID) | ORAL | Status: DC | PRN

## 2015-01-01 MED ORDER — DEXAMETHASONE SODIUM PHOSPHATE 4 MG/ML IJ SOLN
10.0000 mg | Freq: Once | INTRAMUSCULAR | Status: DC
  Filled 2015-01-01: qty 3

## 2015-01-01 MED ORDER — BUPIVACAINE LIPOSOME 1.3 % IJ SUSP
20.0000 mL | Freq: Once | INTRAMUSCULAR | Status: DC
  Filled 2015-01-01: qty 20

## 2015-01-01 MED ORDER — MENTHOL 3 MG MT LOZG: 1.0000 | LOZENGE | OROMUCOSAL | Status: DC | PRN

## 2015-01-01 SURGICAL SUPPLY — 73 items
BAG HAMPER (MISCELLANEOUS) ×2 IMPLANT
BANDAGE ESMARK 6X9 LF (GAUZE/BANDAGES/DRESSINGS) ×1 IMPLANT
BIT DRILL 3.2X128 (BIT) ×2 IMPLANT
BLADE HEX COATED 2.75 (ELECTRODE) ×2 IMPLANT
BNDG ESMARK 6X9 LF (GAUZE/BANDAGES/DRESSINGS) ×2
BOWL SMART MIX CTS (DISPOSABLE) ×2 IMPLANT
CAP KNEE TOTAL 3 SIGMA ×2 IMPLANT
CEMENT HV SMART SET (Cement) ×4 IMPLANT
CLOTH BEACON ORANGE TIMEOUT ST (SAFETY) ×2 IMPLANT
COOLER CRYO CUFF IC AND MOTOR (MISCELLANEOUS) ×2 IMPLANT
COVER LIGHT HANDLE STERIS (MISCELLANEOUS) ×4 IMPLANT
COVER PROBE W GEL 5X96 (DRAPES) ×2 IMPLANT
CUFF CRYO KNEE LG 20X31 COOLER (ORTHOPEDIC SUPPLIES) ×2 IMPLANT
CUFF CRYO KNEE18X23 MED (MISCELLANEOUS) IMPLANT
CUFF TOURNIQUET SINGLE 34IN LL (TOURNIQUET CUFF) ×2 IMPLANT
CUFF TOURNIQUET SINGLE 44IN (TOURNIQUET CUFF) IMPLANT
DECANTER SPIKE VIAL GLASS SM (MISCELLANEOUS) ×4 IMPLANT
DRAPE BACK TABLE (DRAPES) ×2 IMPLANT
DRAPE EXTREMITY T 121X128X90 (DRAPE) ×2 IMPLANT
DRSG AQUACEL AG ADV 3.5X10 (GAUZE/BANDAGES/DRESSINGS) ×2 IMPLANT
DRSG MEPILEX BORDER 4X12 (GAUZE/BANDAGES/DRESSINGS) IMPLANT
DURAPREP 26ML APPLICATOR (WOUND CARE) ×4 IMPLANT
ELECT REM PT RETURN 9FT ADLT (ELECTROSURGICAL) ×2
ELECTRODE REM PT RTRN 9FT ADLT (ELECTROSURGICAL) ×1 IMPLANT
EVACUATOR 3/16  PVC DRAIN (DRAIN) ×1
EVACUATOR 3/16 PVC DRAIN (DRAIN) ×1 IMPLANT
GLOVE BIO SURGEON STRL SZ 6.5 (GLOVE) ×2 IMPLANT
GLOVE BIO SURGEON STRL SZ7 (GLOVE) ×4 IMPLANT
GLOVE BIO SURGEON STRL SZ7.5 (GLOVE) ×2 IMPLANT
GLOVE BIOGEL PI IND STRL 7.0 (GLOVE) ×6 IMPLANT
GLOVE BIOGEL PI IND STRL 7.5 (GLOVE) ×2 IMPLANT
GLOVE BIOGEL PI INDICATOR 7.0 (GLOVE) ×6
GLOVE BIOGEL PI INDICATOR 7.5 (GLOVE) ×2
GLOVE OPTIFIT SS 8.0 STRL (GLOVE) ×2 IMPLANT
GLOVE SKINSENSE NS SZ8.0 LF (GLOVE) ×2
GLOVE SKINSENSE STRL SZ8.0 LF (GLOVE) ×2 IMPLANT
GLOVE SS N UNI LF 8.5 STRL (GLOVE) ×2 IMPLANT
GOWN STRL REUS W/ TWL LRG LVL3 (GOWN DISPOSABLE) ×1 IMPLANT
GOWN STRL REUS W/TWL LRG LVL3 (GOWN DISPOSABLE) ×5 IMPLANT
GOWN STRL REUS W/TWL XL LVL3 (GOWN DISPOSABLE) ×2 IMPLANT
HANDPIECE INTERPULSE COAX TIP (DISPOSABLE) ×1
HOOD W/PEELAWAY (MISCELLANEOUS) ×8 IMPLANT
INST SET MAJOR BONE (KITS) ×2 IMPLANT
IV NS IRRIG 3000ML ARTHROMATIC (IV SOLUTION) ×2 IMPLANT
KIT BLADEGUARD II DBL (SET/KITS/TRAYS/PACK) ×2 IMPLANT
KIT ROOM TURNOVER APOR (KITS) ×2 IMPLANT
MANIFOLD NEPTUNE II (INSTRUMENTS) ×2 IMPLANT
MARKER SKIN DUAL TIP RULER LAB (MISCELLANEOUS) ×2 IMPLANT
NEEDLE HYPO 21X1.5 SAFETY (NEEDLE) IMPLANT
NEEDLE HYPO 25X1 1.5 SAFETY (NEEDLE) ×2 IMPLANT
NS IRRIG 1000ML POUR BTL (IV SOLUTION) ×2 IMPLANT
PACK TOTAL JOINT (CUSTOM PROCEDURE TRAY) ×2 IMPLANT
PAD ARMBOARD 7.5X6 YLW CONV (MISCELLANEOUS) ×2 IMPLANT
PAD DANNIFLEX CPM (ORTHOPEDIC SUPPLIES) ×2 IMPLANT
PIN TROCAR 3 INCH (PIN) ×2 IMPLANT
SAW OSC TIP CART 19.5X105X1.3 (SAW) ×2 IMPLANT
SET BASIN LINEN APH (SET/KITS/TRAYS/PACK) ×2 IMPLANT
SET HNDPC FAN SPRY TIP SCT (DISPOSABLE) ×1 IMPLANT
STAPLER VISISTAT 35W (STAPLE) ×2 IMPLANT
SUT BRALON NAB BRD #1 30IN (SUTURE) ×4 IMPLANT
SUT MNCRL 0 VIOLET CTX 36 (SUTURE) ×1 IMPLANT
SUT MON AB 0 CT1 (SUTURE) ×4 IMPLANT
SUT MON AB 2-0 CT1 36 (SUTURE) IMPLANT
SUT MONOCRYL 0 CTX 36 (SUTURE) ×1
SYR 20CC LL (SYRINGE) ×6 IMPLANT
SYR 30ML LL (SYRINGE) ×2 IMPLANT
SYR BULB IRRIGATION 50ML (SYRINGE) ×2 IMPLANT
TAPE CLOTH SURG 4X10 WHT LF (GAUZE/BANDAGES/DRESSINGS) ×2 IMPLANT
TOWEL OR 17X26 4PK STRL BLUE (TOWEL DISPOSABLE) ×2 IMPLANT
TOWER CARTRIDGE SMART MIX (DISPOSABLE) IMPLANT
TRAY FOLEY CATH SILVER 16FR (SET/KITS/TRAYS/PACK) ×2 IMPLANT
WATER STERILE IRR 1000ML POUR (IV SOLUTION) ×8 IMPLANT
YANKAUER SUCT 12FT TUBE ARGYLE (SUCTIONS) ×2 IMPLANT

## 2015-01-01 NOTE — Anesthesia Procedure Notes (Signed)
Spinal Patient location during procedure: OR Start time: 01/01/2015 7:53 AM Preanesthetic Checklist Completed: patient identified, site marked, surgical consent, pre-op evaluation, timeout performed, IV checked, risks and benefits discussed and monitors and equipment checked Spinal Block Patient position: right lateral decubitus Prep: Betadine Patient monitoring: heart rate, cardiac monitor, continuous pulse ox and blood pressure Approach: right paramedian Location: L3-4 Injection technique: single-shot Needle Needle type: Spinocan  Needle gauge: 22 G Needle length: 9 cm Assessment Sensory level: T8 Events: failed spinal Additional Notes ATTEMPTS:2 TRAY TD:[id number] x  2 TRAY EXPIRATION DATE:04/2016 x2 1st attempt failed.  Repeated with second tray, good results level T8

## 2015-01-01 NOTE — Anesthesia Postprocedure Evaluation (Signed)
  Anesthesia Post-op Note  Patient: Erica Davis  Procedure(s) Performed: Procedure(s): RIGHT TOTAL KNEE REPLACEMENT (Right)  Patient Location: PACU  Anesthesia Type:Spinal  Level of Consciousness: awake, alert  and oriented  Airway and Oxygen Therapy: Patient Spontanous Breathing  Post-op Pain: none  Post-op Assessment: Post-op Vital signs reviewed, Patient's Cardiovascular Status Stable, Respiratory Function Stable, Patent Airway and No signs of Nausea or vomiting LLE Motor Response: No movement due to regional block LLE Sensation: No sensation (absent) RLE Motor Response: No movement due to regional block RLE Sensation: No sensation (absent) L Sensory Level: L3-Anterior knee, lower leg R Sensory Level: L3-Anterior knee, lower leg  Post-op Vital Signs: Reviewed and stable  Last Vitals:  Filed Vitals:   01/01/15 1114  BP: 107/57  Pulse:   Temp:   Resp:     Complications: No apparent anesthesia complications, spinal [id number]

## 2015-01-01 NOTE — Progress Notes (Signed)
PHARMACIST - PHYSICIAN ORDER COMMUNICATION  CONCERNING: P&T Medication Policy on Herbal Medications  DESCRIPTION:  This patient's order for:  CoEnzyme Q  has been noted.  This product(s) is classified as an "herbal" or natural product. Due to a lack of definitive safety studies or FDA approval, nonstandard manufacturing practices, plus the potential risk of unknown drug-drug interactions while on inpatient medications, the Pharmacy and Therapeutics Committee does not permit the use of "herbal" or natural products of this type within [place].   ACTION TAKEN: The pharmacy department is unable to verify this order at this time and your patient has been informed of this safety policy. Please reevaluate patient's clinical condition at discharge and address if the herbal or natural product(s) should be resumed at that time.  

## 2015-01-01 NOTE — Plan of Care (Signed)
Problem: Acute Rehab PT Goals(only PT should resolve) Goal: Patient Will Transfer Sit To/From Stand Pt will transfer sit to/from-stand with RW at ModI without loss-of-balance to demonstrate good safety awareness for independent mobility in home.     Goal: Pt Will Ambulate Pt will ambulate with RW at Supervision using a step-through pattern and equal step length for a distances greater than 200ft to demonstrate the ability to perform safe household distance ambulation at discharge.        

## 2015-01-01 NOTE — Anesthesia Preprocedure Evaluation (Signed)
Anesthesia Evaluation  Patient identified by MRN, date of birth, ID band Patient awake    Reviewed: Allergy & Precautions, NPO status , Patient's Chart, lab work & pertinent test results  Airway Mallampati: I  TM Distance: >3 FB     Dental  (+) Teeth Intact   Pulmonary neg pulmonary ROS,  breath sounds clear to auscultation        Cardiovascular hypertension (no meds), Rhythm:Regular Rate:Normal     Neuro/Psych    GI/Hepatic negative GI ROS,   Endo/Other    Renal/GU      Musculoskeletal   Abdominal   Peds  Hematology   Anesthesia Other Findings   Reproductive/Obstetrics                             Anesthesia Physical Anesthesia Plan  ASA: II  Anesthesia Plan: Spinal   Post-op Pain Management:    Induction:   Airway Management Planned: Simple Face Mask  Additional Equipment:   Intra-op Plan:   Post-operative Plan:   Informed Consent: I have reviewed the patients History and Physical, chart, labs and discussed the procedure including the risks, benefits and alternatives for the proposed anesthesia with the patient or authorized representative who has indicated his/her understanding and acceptance.     Plan Discussed with:   Anesthesia Plan Comments:         Anesthesia Quick Evaluation

## 2015-01-01 NOTE — Clinical Social Work Note (Signed)
CSW received consult for possible placement. PT has evaluated pt and recommendation is for home health at this time. CSW will sign off, but can be reconsulted if needed.  Benay Pike, [place]

## 2015-01-01 NOTE — Transfer of Care (Signed)
Immediate Anesthesia Transfer of Care Note  Patient: Erica Davis  Procedure(s) Performed: Procedure(s): RIGHT TOTAL KNEE REPLACEMENT (Right)  Patient Location: PACU  Anesthesia Type:Spinal  Level of Consciousness: awake, alert  and oriented  Airway & Oxygen Therapy: Patient Spontanous Breathing and Patient connected to nasal cannula oxygen  Post-op Assessment: Report given to RN  Post vital signs: Reviewed and stable  Last Vitals:  Filed Vitals:   01/01/15 0646  Pulse: 71  Temp: 37 C  Resp: 18    Complications: No apparent anesthesia complications

## 2015-01-01 NOTE — Evaluation (Signed)
Physical Therapy Evaluation Patient Details Name: EMILEY DIGIACOMO MRN: [id number] DOB: [birth year] Today's Date: 01/01/2015   History of Present Illness  Pt is a 83 white F who presents several hour s/p R TKA. Pt reports 6 month hisotry of chronic, degenerative knee function and pain.   Clinical Impression  Pt is received semirecumbent in bed upon entry, awake, alert, and willing to participate. No acute distress noted, denying c/o pain in R knee, only mild HA. Pt is A&Ox3 and pleasant. Pt reports zero falls in the last 6 months. Pt strength as screened by functional mobility assessment and therex is mild impaired, but patient is able to perform most of HEP s physical assistance. Patient presenting with impairment of strength, range of motion, balance, and activity tolerance, limiting ability to perform ADL and mobility tasks at  baseline level of function. Patient will benefit from skilled intervention to address the above impairments and limitations, in order to restore to prior level of function, improve patient safety upon discharge, and to decrease falls risk.       Follow Up Recommendations Home health PT    Equipment Recommendations  Rolling walker with 5" wheels    Recommendations for Other Services Rehab consult     Precautions / Restrictions Precautions Precautions: None Restrictions Weight Bearing Restrictions: Yes RLE Weight Bearing: Weight bearing as tolerated      Mobility  Bed Mobility Overal bed mobility: Modified Independent             General bed mobility comments: Requires additional time, but able to perform s assistance.   Transfers Overall transfer level: Needs assistance Equipment used: Rolling walker (2 wheeled) Transfers: Sit to/from Stand Sit to Stand: Supervision         General transfer comment: Performs easily demonstrate good functional strength.   Ambulation/Gait Ambulation/Gait assistance: Min guard Ambulation Distance (Feet): 30  Feet Assistive device: Rolling walker (2 wheeled) Gait Pattern/deviations: Decreased step length - right;Decreased step length - left   Gait velocity interpretation: <1.8 ft/sec, indicative of risk for recurrent falls General Gait Details: Step length is equal bilat, ~ 7 inches, progressing the RLE c fixed knee.   Stairs            Wheelchair Mobility    Modified Rankin (Stroke Patients Only)       Balance Overall balance assessment: Modified Independent;No apparent balance deficits (not formally assessed)                                           Pertinent Vitals/Pain Pain Assessment: 0-10 Pain Score: 0-No pain Pain Location: R knee  Pain Intervention(s): Monitored during session;Ice applied    Home Living Family/patient expects to be discharged to:: Private residence Living Arrangements: Spouse/significant other Available Help at Discharge: Family Type of Home: House Home Access: Level entry     Home Layout: One level Home Equipment: None      Prior Function Level of Independence: Independent with assistive device(s)         Comments: Previously wearing a knee brace during all ADL, IADL      Hand Dominance   Dominant Hand: Right    Extremity/Trunk Assessment   Upper Extremity Assessment: Overall WFL for tasks assessed           Lower Extremity Assessment: RLE deficits/detail RLE Deficits / Details: postsurgical weakness.     Cervical /  Trunk Assessment: Normal  Communication   Communication: No difficulties  Cognition Arousal/Alertness: Lethargic;Suspect due to medications Behavior During Therapy: [facility] for tasks assessed/performed Overall Cognitive Status: Within Functional Limits for tasks assessed                      General Comments      Exercises Total Joint Exercises Ankle Circles/Pumps: AROM;15 reps;Supine;Both Quad Sets: AROM;Right;15 reps;Supine;Strengthening Short Arc Quad:  AROM;Strengthening;Right;15 reps;Supine Heel Slides: AAROM;Strengthening;Right;15 reps;Supine Hip ABduction/ADduction: AAROM;Strengthening;Right;15 reps;Supine Straight Leg Raises: AROM;Strengthening;Right;15 reps;Supine Goniometric ROM: 7-95+ degrees painfree; additional flexion mobility not pursued at this time.       Assessment/Plan    PT Assessment Patient needs continued PT services  PT Diagnosis Difficulty walking;Abnormality of gait;Generalized weakness   PT Problem List Decreased strength;Decreased range of motion;Decreased activity tolerance;Decreased balance;Decreased mobility;Decreased coordination  PT Treatment Interventions DME instruction;Gait training;Stair training;Functional mobility training;Therapeutic activities;Therapeutic exercise;Balance training   PT Goals (Current goals can be found in the Care Plan section) Acute Rehab PT Goals Patient Stated Goal: Recover strenth and indep mobility  PT Goal Formulation: With patient Time For Goal Achievement: 01/15/15 Potential to Achieve Goals: Good    Frequency BID   Barriers to discharge        Co-evaluation               End of Session Equipment Utilized During Treatment: Gait belt Activity Tolerance: Patient tolerated treatment well;No increased pain Patient left: in bed;with call bell/phone within reach;with bed alarm set;with family/visitor present Nurse Communication: Mobility status;Other (comment)         Time: 1420-1501 PT Time Calculation (min) (ACUTE ONLY): 41 min   Charges:   PT Evaluation $Initial PT Evaluation Tier I: 1 Procedure PT Treatments $Therapeutic Exercise: 23-37 mins   PT G Codes:        Aniston Christman C 05-Jan-2015, 4:00 PM  4:02 PM  Etta Grandchild, PT, DPT [place] License # [id number]

## 2015-01-01 NOTE — Brief Op Note (Signed)
01/01/2015  9:59 AM  PATIENT:  Erica Davis  59 y.o. female  PRE-OPERATIVE DIAGNOSIS:  osteoarthritis right knee  POST-OPERATIVE DIAGNOSIS:  osteoarthritis right knee  PROCEDURE:  Procedure(s): RIGHT TOTAL KNEE REPLACEMENT (Right)  SURGEON:  Surgeon(s) and Role:    * Carole Civil, MD - Primary  PHYSICIAN ASSISTANT:   ASSISTANTS: deb blackwell and betty ashley    ANESTHESIA:   spinal  EBL:  Total I/O In: 1000 [I.V.:1000] Out: 425 [Urine:400; Blood:25]  BLOOD ADMINISTERED:none  DRAINS: hemovac  LOCAL MEDICATIONS USED:  MARCAINE   , Amount: 30 ml and OTHER exparel 20cc  SPECIMEN:  No Specimen  DISPOSITION OF SPECIMEN:  N/A  COUNTS:  YES  TOURNIQUET:   Total Tourniquet Time Documented: Thigh (Right) - 74 minutes Total: Thigh (Right) - 74 minutes   DICTATION: .Viviann Spare Dictation  PLAN OF CARE: Admit to inpatient   PATIENT DISPOSITION:  PACU - hemodynamically stable.   Delay start of Pharmacological VTE agent (>24hrs) due to surgical blood loss or risk of bleeding: yes  Surgical dictation for RIGHT TKA Preop diagnosis osteoarthritis RIGHT TKA Postop diagnosis osteoarthritis RIGHT TKA Surgeon Dr. Aline Brochure [phone]  Implants DEPUY  SIGMA PS FB   SIZES:    F 2   T 2.5   P 38/8.5  Poly 10/PS   Drains: one Hemovac drain in the joint   Exparel 20   Marcaine with epinephrine 30    Operative findings : Operative findings the patient had a slight flexion contracture under anesthesia she had 125 of flexion. Intraoperative findings included severe patellofemoral arthritis with multiple osteophytes. The patella had worn a groove on its undersurface and there was no cartilage. There was a kissing lesion on the trochlear side. The medial compartment had grade 4 wear as well the lateral compartment had grade 2 wear the tibial plateau medially had grade 3 wear in the lateral compartment tibial side looked grade 1 wear.      details of procedure:   The patient  was identified in the preop holding area and the surgical site was confirmed as the left knee. Chart review and update were completed. The patient was taken to the operating room for spinal anesthesia. After successful spinal anesthesia Foley catheter was inserted. The patient was placed supine on the operating table.   the left leg was prepped with ChloraPrep and draped sterilely. Timeout was completed. The limb was then exsanguinated a  6 inch Esmarch. The tourniquet was elevated to 300 mmHg.   A midline incision was made and taken down to the extensor mechanism followed by medial arthrotomy. The patella was everted. Side effect to me was performed as needed. The osteophytes were resected.  Anterior cruciate ligament and PCL and medial and lateral meniscus were resected.   a 3/8 inch drill bit was used to enter the femoral canal which was suctioned and irrigated until the fluid was clear. The distal femoral cut was set for 11 millimeter resection with a 5   Left Valgus angle. This cut was completed and checked for flatness.   the femur was then measured to a size 2.  The cutting block was placed to match the epicondyles and the 4 distal cuts were made.   the tibia was subluxated forward and the external alignment guide was placed. We removed 8 mm of bone from the higher lateral side. We set the guide for neutral varus valgus cut related to the  Mechanical axis of the tibia and for slope matching  the patient's anatomy. Rotational alignment was set using the tibial tubercle, tibial spine and second metatarsal. The cutting block was pinned and the proximal tibia was resected.    spacer blocks were placed starting with a 10 mm insert to confirm equal flexion-extension gaps. A size  10  mm insert balanced the gaps.   We placed the femoral notch cutting guide size 2  and resected the notch.   Trial implants replaced using appropriate size femur , appropriate size tibial baseplate which was measured  after the proximal tibia resection. Tibial rotation was set patella tracking was normal   The tibia was then punched per manufacture technique making sure to avoid internal rotation.   The patella measured a size 17   We resected down to a size 11 using a size 38X8.5 button.   Final range of motion check was performed with the appropriate size trials as mentioned above. Satisfactory reduction and motion were obtained.   Trial implants were removed. The bone was irrigated and dried and the cement was mixed on the back table  These implants were then cemented in place. Excess cement was removed. The cement was allowed to cure. Second irrigation was performed.    FInal range of motion check and stability check was completed  The wound was irrigated third time Hemovac drain was placed, extensor mechanism was closed with #1 Nurolon followed by 0 Monocryl and staples to reapproximate the skin edges and subcutaneous tissue.   Sterile dressing was applied  The patient was taken recovery in stable condition with activated Cryo/Cuff

## 2015-01-01 NOTE — Op Note (Signed)
01/01/2015  9:59 AM  PATIENT:  Erica Davis  59 y.o. female  PRE-OPERATIVE DIAGNOSIS:  osteoarthritis right knee  POST-OPERATIVE DIAGNOSIS:  osteoarthritis right knee  PROCEDURE:  Procedure(s): RIGHT TOTAL KNEE REPLACEMENT (Right)  SURGEON:  Surgeon(s) and Role:    * Carole Civil, MD - Primary  PHYSICIAN ASSISTANT:   ASSISTANTS: deb blackwell and betty ashley    ANESTHESIA:   spinal  EBL:  Total I/O In: 1000 [I.V.:1000] Out: 425 [Urine:400; Blood:25]  BLOOD ADMINISTERED:none  DRAINS: hemovac  LOCAL MEDICATIONS USED:  MARCAINE   , Amount: 30 ml and OTHER exparel 20cc  SPECIMEN:  No Specimen  DISPOSITION OF SPECIMEN:  N/A  COUNTS:  YES  TOURNIQUET:   Total Tourniquet Time Documented: Thigh (Right) - 74 minutes Total: Thigh (Right) - 74 minutes   DICTATION: .Viviann Spare Dictation  PLAN OF CARE: Admit to inpatient   PATIENT DISPOSITION:  PACU - hemodynamically stable.   Delay start of Pharmacological VTE agent (>24hrs) due to surgical blood loss or risk of bleeding: yes  Surgical dictation for RIGHT TKA Preop diagnosis osteoarthritis RIGHT TKA Postop diagnosis osteoarthritis RIGHT TKA Surgeon Dr. Aline Brochure [phone]  Implants DEPUY  SIGMA PS FB   SIZES:    F 2   T 2.5   P 38/8.5  Poly 10/PS   Drains: one Hemovac drain in the joint   Exparel 20   Marcaine with epinephrine 30    Operative findings : Operative findings the patient had a slight flexion contracture under anesthesia she had 125 of flexion. Intraoperative findings included severe patellofemoral arthritis with multiple osteophytes. The patella had worn a groove on its undersurface and there was no cartilage. There was a kissing lesion on the trochlear side. The medial compartment had grade 4 wear as well the lateral compartment had grade 2 wear the tibial plateau medially had grade 3 wear in the lateral compartment tibial side looked grade 1 wear.      details of procedure:   The patient  was identified in the preop holding area and the surgical site was confirmed as the left knee. Chart review and update were completed. The patient was taken to the operating room for spinal anesthesia. After successful spinal anesthesia Foley catheter was inserted. The patient was placed supine on the operating table.   the left leg was prepped with ChloraPrep and draped sterilely. Timeout was completed. The limb was then exsanguinated a  6 inch Esmarch. The tourniquet was elevated to 300 mmHg.   A midline incision was made and taken down to the extensor mechanism followed by medial arthrotomy. The patella was everted. Side effect to me was performed as needed. The osteophytes were resected.  Anterior cruciate ligament and PCL and medial and lateral meniscus were resected.   a 3/8 inch drill bit was used to enter the femoral canal which was suctioned and irrigated until the fluid was clear. The distal femoral cut was set for 11 millimeter resection with a 5   Left Valgus angle. This cut was completed and checked for flatness.   the femur was then measured to a size 2.  The cutting block was placed to match the epicondyles and the 4 distal cuts were made.   the tibia was subluxated forward and the external alignment guide was placed. We removed 8 mm of bone from the higher lateral side. We set the guide for neutral varus valgus cut related to the  Mechanical axis of the tibia and for slope matching  the patient's anatomy. Rotational alignment was set using the tibial tubercle, tibial spine and second metatarsal. The cutting block was pinned and the proximal tibia was resected.    spacer blocks were placed starting with a 10 mm insert to confirm equal flexion-extension gaps. A size  10  mm insert balanced the gaps.   We placed the femoral notch cutting guide size 2  and resected the notch.   Trial implants replaced using appropriate size femur , appropriate size tibial baseplate which was measured  after the proximal tibia resection. Tibial rotation was set patella tracking was normal   The tibia was then punched per manufacture technique making sure to avoid internal rotation.   The patella measured a size 17   We resected down to a size 11 using a size 38X8.5 button.   Final range of motion check was performed with the appropriate size trials as mentioned above. Satisfactory reduction and motion were obtained.   Trial implants were removed. The bone was irrigated and dried and the cement was mixed on the back table  These implants were then cemented in place. Excess cement was removed. The cement was allowed to cure. Second irrigation was performed.    FInal range of motion check and stability check was completed  The wound was irrigated third time Hemovac drain was placed, extensor mechanism was closed with #1 Nurolon followed by 0 Monocryl and staples to reapproximate the skin edges and subcutaneous tissue.   Sterile dressing was applied  The patient was taken recovery in stable condition with activated Cryo/Cuff

## 2015-01-01 NOTE — Interval H&P Note (Signed)
History and Physical Interval Note:  01/01/2015 7:23 AM  Erica Davis  has presented today for surgery, with the diagnosis of osteoarthritis right knee  The various methods of treatment have been discussed with the patient and family. After consideration of risks, benefits and other options for treatment, the patient has consented to  Procedure(s): TOTAL KNEE ARTHROPLASTY (Right) as a surgical intervention .  The patient's history has been reviewed, patient examined, no change in status, stable for surgery.  I have reviewed the patient's chart and labs.  Questions were answered to the patient's satisfaction.     Arther Abbott

## 2015-01-02 ENCOUNTER — Encounter (HOSPITAL_COMMUNITY): Payer: Self-pay | Admitting: Orthopedic Surgery

## 2015-01-02 LAB — BASIC METABOLIC PANEL
Anion gap: 5 (ref 5–15)
BUN: 9 mg/dL (ref 6–20)
CO2: 26 mmol/L (ref 22–32)
Calcium: 8.4 mg/dL — ABNORMAL LOW (ref 8.9–10.3)
Chloride: 111 mmol/L (ref 101–111)
Creatinine, Ser: 0.6 mg/dL (ref 0.44–1.00)
GFR calc Af Amer: >60 mL/min (ref >60)
GFR calc non Af Amer: >60 mL/min (ref >60)
Glucose, Bld: 109 mg/dL — ABNORMAL HIGH (ref 65–99)
Potassium: 3.7 mmol/L (ref 3.5–5.1)
Sodium: 142 mmol/L (ref 135–145)

## 2015-01-02 LAB — CBC
HCT: 37.6 % (ref 36.0–46.0)
Hemoglobin: 12.4 g/dL (ref 12.0–15.0)
MCH: 31.2 pg (ref 26.0–34.0)
MCHC: 33 g/dL (ref 30.0–36.0)
MCV: 94.7 fL (ref 78.0–100.0)
Platelets: 205 10*3/uL (ref 150–400)
RBC: 3.97 MIL/uL (ref 3.87–5.11)
RDW: 13.1 % (ref 11.5–15.5)
WBC: 8.4 10*3/uL (ref 4.0–10.5)

## 2015-01-02 MED ORDER — HYDROCODONE-ACETAMINOPHEN 5-325 MG PO TABS
1.0000 | ORAL_TABLET | ORAL | Status: DC | PRN
  Administered 2015-01-02: 1 via ORAL
  Filled 2015-01-02: qty 1

## 2015-01-02 MED ORDER — DEXAMETHASONE SODIUM PHOSPHATE 4 MG/ML IJ SOLN
10.0000 mg | Freq: Once | INTRAMUSCULAR | Status: AC
  Administered 2015-01-02: 10 mg via INTRAVENOUS
  Filled 2015-01-02: qty 3

## 2015-01-02 NOTE — Progress Notes (Signed)
Subjective: 1 Day Post-Op Procedure(s) (LRB): RIGHT TOTAL KNEE REPLACEMENT (Right) Patient reports pain as 0 on 0-10 scale.    Objective: Vital signs in last 24 hours: Temp:  [97.4 F (36.3 C)-98.6 F (37 C)] 97.6 F (36.4 C) (08/18 0500) Pulse Rate:  [62-77] 63 (08/18 0500) Resp:  [13-21] 20 (08/18 0500) BP: (104-144)/(52-71) 109/68 mmHg (08/18 0500) SpO2:  [95 %-98 %] 97 % (08/18 0500)  Intake/Output from previous day: 08/17 0701 - 08/18 0700 In: 3136.7 [P.O.:360; I.V.:2676.7; IV Piggyback:100] Out: 4415 [Urine:4100; Drains:290; Blood:25] Intake/Output this shift:     Recent Labs  01/02/15 0553  HGB 12.4    Recent Labs  01/02/15 0553  WBC 8.4  RBC 3.97  HCT 37.6  PLT 205    Recent Labs  01/02/15 0553  NA 142  K 3.7  CL 111  CO2 26  BUN 9  CREATININE 0.60  GLUCOSE 109*  CALCIUM 8.4*   No results for input(s): LABPT, INR in the last 72 hours.  Neurologically intact Neurovascular intact Sensation intact distally Intact pulses distally Dorsiflexion/Plantar flexion intact  Assessment/Plan: 1 Day Post-Op Procedure(s) (LRB): RIGHT TOTAL KNEE REPLACEMENT (Right) Advance diet Up with therapy D/C IV fluids  Arther Abbott 01/02/2015, 8:19 AM

## 2015-01-02 NOTE — Progress Notes (Signed)
Physical Therapy Treatment Patient Details Name: Erica Davis MRN: [id number] DOB: 02/01/1956 Today's Date: 01/02/2015    History of Present Illness Pt is a 46 white F who presents several hour s/p R TKA. Pt reports 6 month hisotry of chronic, degenerative knee function and pain.     PT Comments    Pt was seen for tx following lunch.  She is now able to eat well and pain is well controlled.  She was instructed in transfers and gait, now able to ambulate 150' with an excellent gait pattern.  Knee ROM is 8-110 degrees actively.  A CPM was set up and set at 0-60 degrees.  Pt was very comfortable and asked for the flexion to increase a bit so it was increased to 70 degrees flexion.  She tolerated this well.  I have asked RN to remove it between 7:30 and 9:30 this evening.  She is progressing exceedingly well!  Follow Up Recommendations  Home health PT     Equipment Recommendations  Rolling walker with 5" wheels    Recommendations for Other Services  none     Precautions / Restrictions Restrictions Weight Bearing Restrictions: Yes RLE Weight Bearing: Weight bearing as tolerated    Mobility  Bed Mobility Overal bed mobility: Modified Independent;Needs Assistance Bed Mobility: Sit to Supine       Sit to supine: Min assist   General bed mobility comments: pt instructed in using the LLE to assist the RLE into the bed but she was unable to do so and needed min assist of therapist  Transfers Overall transfer level: Modified independent Equipment used: Rolling walker (2 wheeled) Transfers: Sit to/from Stand Sit to Stand: Modified independent (Device/Increase time)         General transfer comment: from bed, commode  Ambulation/Gait Ambulation/Gait assistance: Supervision Ambulation Distance (Feet): 150 Feet Assistive device: Rolling walker (2 wheeled) Gait Pattern/deviations: WFL(Within Functional Limits)   Gait velocity interpretation: >2.62 ft/sec, indicative of  independent community ambulator General Gait Details: excellent gait pattern   Stairs            Wheelchair Mobility    Modified Rankin (Stroke Patients Only)       Balance Overall balance assessment: No apparent balance deficits (not formally assessed)                                  Cognition Arousal/Alertness: Awake/alert Behavior During Therapy: WFL for tasks assessed/performed Overall Cognitive Status: Within Functional Limits for tasks assessed                      Exercises Total Joint Exercises Ankle Circles/Pumps: AROM;15 reps;Supine;Both Quad Sets: AROM;Right;15 reps;Supine;Strengthening Short Arc Quad: AROM;Strengthening;Right;15 reps;Supine Heel Slides: AAROM;Strengthening;Right;15 reps;Supine Straight Leg Raises: AROM;Strengthening;Right;15 reps;Supine Goniometric ROM: 8-110 degrees, active right knee    General Comments        Pertinent Vitals/Pain Pain Assessment: No/denies pain Pain Score: 4  Pain Location: right knee Pain Descriptors / Indicators: Aching;Sore Pain Intervention(s): Limited activity within patient's tolerance;Ice applied;Repositioned    Home Living                      Prior Function            PT Goals (current goals can now be found in the care plan section) Progress towards PT goals: Progressing toward goals    Frequency  BID  PT Plan Current plan remains appropriate    Co-evaluation             End of Session Equipment Utilized During Treatment: Gait belt Activity Tolerance: Patient tolerated treatment well;No increased pain Patient left: in bed;in CPM     Time: 1245-1328 PT Time Calculation (min) (ACUTE ONLY): 43 min  Charges:  $Gait Training: 8-22 mins $Therapeutic Exercise: 8-22 mins $Therapeutic Activity: 8-22 mins $Self Care/Home Management: 8-22                    G Codes:      Demetrios Isaacs L  PT 01/02/2015, 1:40 PM [phone]

## 2015-01-02 NOTE — Addendum Note (Signed)
Addendum  created 01/02/15 9692 by Vista Deck, CRNA   Modules edited: Notes Section   Notes Section:  File: [id number]

## 2015-01-02 NOTE — Progress Notes (Signed)
Physical Therapy Treatment Patient Details Name: Erica Davis MRN: [id number] DOB: April 16, 1956 Today's Date: 01/02/2015    History of Present Illness Pt is a 69 white F who presents several hour s/p R TKA. Pt reports 6 month hisotry of chronic, degenerative knee function and pain.     PT Comments    Pt is progressing extremely well.  She reports minimal pain in the right knee.  ROM of the knee is 10-110degrees, actively.  She is able to do a SAQ and SLR independently.  She was instructed in transfers from bed, commode and chair needing only supervision for safety.  She was instructed in gait with a walker, mildly antalgic gait pattern.  Pt and husband were instructed in TKR protocol, operation of the Cryovac and knee precautions.  We will begin the CPM after lunch.  Follow Up Recommendations  Home health PT     Equipment Recommendations  Rolling walker with 5" wheels    Recommendations for Other Services  none     Precautions / Restrictions Restrictions Weight Bearing Restrictions: Yes RLE Weight Bearing: Weight bearing as tolerated    Mobility  Bed Mobility Overal bed mobility: Modified Independent                Transfers Overall transfer level: Needs assistance Equipment used: Rolling walker (2 wheeled) Transfers: Sit to/from Stand Sit to Stand: Supervision            Ambulation/Gait Ambulation/Gait assistance: Supervision Ambulation Distance (Feet): 40 Feet Assistive device: Rolling walker (2 wheeled) Gait Pattern/deviations: Decreased stride length   Gait velocity interpretation: <1.8 ft/sec, indicative of risk for recurrent falls     Stairs            Wheelchair Mobility    Modified Rankin (Stroke Patients Only)       Balance Overall balance assessment: Modified Independent;No apparent balance deficits (not formally assessed)                                  Cognition Arousal/Alertness: Awake/alert Behavior During  Therapy: WFL for tasks assessed/performed Overall Cognitive Status: Within Functional Limits for tasks assessed                      Exercises Total Joint Exercises Ankle Circles/Pumps: AROM;15 reps;Supine;Both Quad Sets: AROM;Right;15 reps;Supine;Strengthening Short Arc Quad: AROM;Strengthening;Right;15 reps;Supine Heel Slides: AAROM;Strengthening;Right;15 reps;Supine Straight Leg Raises: AROM;Strengthening;Right;15 reps;Supine Goniometric ROM: 10-110 degrees, right knee    General Comments        Pertinent Vitals/Pain Pain Assessment: 0-10 Pain Score: 4  Pain Location: right knee Pain Descriptors / Indicators: Aching;Sore Pain Intervention(s): Limited activity within patient's tolerance;Ice applied;Repositioned    Home Living                      Prior Function            PT Goals (current goals can now be found in the care plan section) Progress towards PT goals: Progressing toward goals    Frequency  BID    PT Plan Current plan remains appropriate    Co-evaluation             End of Session Equipment Utilized During Treatment: Gait belt Activity Tolerance: Patient tolerated treatment well;No increased pain Patient left: in chair;with call bell/phone within reach;with family/visitor present     Time: 6314-9702 PT Time Calculation (min) (ACUTE ONLY): 61  min  Charges:  $Gait Training: 8-22 mins $Therapeutic Exercise: 8-22 mins $Therapeutic Activity: 8-22 mins $Self Care/Home Management: 8-22                    G CodesDemetrios Isaacs LPT 01/02/2015, 10:54 AM [phone]

## 2015-01-02 NOTE — Care Management Note (Signed)
Case Management Note  Patient Details  Name: Erica Davis MRN: [id number] Date of Birth: 1956-02-23  Subjective/Objective:                  Pt admitted s/p total knee surgery. Pt lives with her husband and will return home at discharge. Pt had been fairly independent with ADL's prior to admission.   Action/Plan: Pts HH PT and CPM has been arranged by Dr. Aline Brochure office prior to surgery. HH PT arranged with [facility]. [place] services to start within 48 hours of discharge. CPM is with [facility] and they will need to be called at discharge so CPM can be delivered to pts home. Pt stated she was getting a rolling walker and BSC from a family friend. Will continue to follow for discharge planning needs.  Expected Discharge Date:                  Expected Discharge Plan:  [place]  In-House Referral:  NA  Discharge planning Services  CM Consult  Post Acute Care Choice:  Durable Medical Equipment, Home Health Choice offered to:  Patient  DME Arranged:  CPM DME Agency:  [facility]  HH Arranged:  PT HH Agency:  [facility]  Status of Service:  Completed, signed off  Medicare Important Message Given:    Date Medicare IM Given:    Medicare IM give by:    Date Additional Medicare IM Given:    Additional Medicare Important Message give by:     If discussed at [place] of Stay Meetings, dates discussed:    Additional Comments:  Joylene Draft, RN 01/02/2015, 11:32 AM

## 2015-01-02 NOTE — Anesthesia Postprocedure Evaluation (Signed)
  Anesthesia Post-op Note  Patient: Erica Davis  Procedure(s) Performed: Procedure(s): RIGHT TOTAL KNEE REPLACEMENT (Right)  Patient Location: Nursing Unit  Anesthesia Type:Spinal  Level of Consciousness: awake and alert   Airway and Oxygen Therapy: Patient Spontanous Breathing  Post-op Pain: mild  Post-op Assessment: Post-op Vital signs reviewed, Patient's Cardiovascular Status Stable, Respiratory Function Stable, Patent Airway, No signs of Nausea or vomiting and Pain level controlled LLE Motor Response: Purposeful movement LLE Sensation: Full sensation RLE Motor Response: Purposeful movement RLE Sensation: Pain L Sensory Level: L3-Anterior knee, lower leg R Sensory Level: L3-Anterior knee, lower leg  Post-op Vital Signs: Reviewed and stable  Last Vitals:  Filed Vitals:   01/02/15 0500  BP: 109/68  Pulse: 63  Temp: 36.4 C  Resp: 20    Complications: No apparent anesthesia complications

## 2015-01-03 LAB — CBC
HCT: 36.1 % (ref 36.0–46.0)
Hemoglobin: 11.8 g/dL — ABNORMAL LOW (ref 12.0–15.0)
MCH: 31.1 pg (ref 26.0–34.0)
MCHC: 32.7 g/dL (ref 30.0–36.0)
MCV: 95 fL (ref 78.0–100.0)
Platelets: 208 10*3/uL (ref 150–400)
RBC: 3.8 MIL/uL — ABNORMAL LOW (ref 3.87–5.11)
RDW: 13.3 % (ref 11.5–15.5)
WBC: 7.8 10*3/uL (ref 4.0–10.5)

## 2015-01-03 MED ORDER — ASPIRIN 325 MG PO TBEC: 325.0000 mg | DELAYED_RELEASE_TABLET | Freq: Two times a day (BID) | ORAL | Status: DC

## 2015-01-03 MED ORDER — POLYETHYLENE GLYCOL 3350 17 G PO PACK: 17.0000 g | PACK | Freq: Every day | ORAL | Status: DC

## 2015-01-03 MED ORDER — SODIUM CHLORIDE 0.9 % IV SOLN
Freq: Once | INTRAVENOUS | Status: AC
  Administered 2015-01-03: 11:00:00 via INTRAVENOUS

## 2015-01-03 MED ORDER — HYDROCODONE-ACETAMINOPHEN 5-325 MG PO TABS: 1.0000 | ORAL_TABLET | ORAL | Status: DC | PRN

## 2015-01-03 NOTE — Progress Notes (Signed)
Physical Therapy Treatment Patient Details Name: Erica Davis MRN: [id number] DOB: September 14, 1955 Today's Date: 01/03/2015    History of Present Illness Pt is a 42 white F who presents several hour s/p R TKA. Pt reports 6 month hisotry of chronic, degenerative knee function and pain.     PT Comments    Pt was given IV fluids due to earlier hypotension and she reports feeling better.  She was able to transfer OOB independently and ambulate 100' with no pain using a walker.  BP standing after gait=130/76.  Active ROM=8-110 degrees.  She was placed in CPM at 0-70 degrees (ended up tolerating 0-60 degrees yesterday).  Follow Up Recommendations  Home health PT     Equipment Recommendations  Rolling walker with 5" wheels    Recommendations for Other Services  none     Precautions / Restrictions Precautions Precautions: None Restrictions Weight Bearing Restrictions: Yes RLE Weight Bearing: Weight bearing as tolerated    Mobility  Bed Mobility Overal bed mobility: Modified Independent                Transfers Overall transfer level: Modified independent Equipment used: Rolling walker (2 wheeled)                Ambulation/Gait Ambulation/Gait assistance: Modified independent (Device/Increase time) Ambulation Distance (Feet): 100 Feet Assistive device: Rolling walker (2 wheeled) Gait Pattern/deviations: Decreased stance time - right   Gait velocity interpretation: >2.62 ft/sec, indicative of independent Presenter, broadcasting Rankin (Stroke Patients Only)       Balance Overall balance assessment: No apparent balance deficits (not formally assessed)                                  Cognition Arousal/Alertness: Awake/alert Behavior During Therapy: WFL for tasks assessed/performed Overall Cognitive Status: Within Functional Limits for tasks assessed                       Exercises      General Comments        Pertinent Vitals/Pain Pain Assessment: No/denies pain    Home Living                      Prior Function            PT Goals (current goals can now be found in the care plan section) Progress towards PT goals: Progressing toward goals    Frequency  BID    PT Plan Current plan remains appropriate    Co-evaluation             End of Session Equipment Utilized During Treatment: Gait belt Activity Tolerance: Patient tolerated treatment well;No increased pain Patient left: in bed;in CPM     Time: 1350-1437 PT Time Calculation (min) (ACUTE ONLY): 47 min  Charges:  $Gait Training: 8-22 mins $Self Care/Home Management: 8-22                    G CodesSable Feil  PT 01/03/2015, 2:32 PM [phone]

## 2015-01-03 NOTE — Progress Notes (Signed)
Physical Therapy Treatment Patient Details Name: Erica Davis MRN: [id number] DOB: [birth year] Today's Date: 01/03/2015    History of Present Illness Pt is a 20 white F who presents several hour s/p R TKA. Pt reports 6 month hisotry of chronic, degenerative knee function and pain.     PT Comments    Pt reported having poor sleep last night due to a noisy pt next door.  Her right knee has increased visible edema but active ROM is 8-110 degrees.  She tolerated therapeutic exercise well with independent SAQ and SLR.  During gait in the hallway today she developed symptoms of hypotension after walking about 90'.  She was seated in a w/c and returned to her room.  Sitting BP=89/41.  RN was alerted.  Pt was transferred to supine and after resting about 10 minutes supine BP=106/69 with symptoms resolved.  Ice pack was returned to her knee along with SCDs.  All procedures for transfer to home were discussed.  She was given a total joint replacement booklet along with a written home exercise program.  Technique for going up and down a step was verbally given since she was unable to practice this earlier.  If not discharged, we will see her again later on today.  Follow Up Recommendations  Home health PT     Equipment Recommendations  Rolling walker with 5" wheels    Recommendations for Other Services  none     Precautions / Restrictions Precautions Precautions: None Restrictions RLE Weight Bearing: Weight bearing as tolerated    Mobility  Bed Mobility Overal bed mobility: Needs Assistance Bed Mobility: Supine to Sit     Supine to sit: Modified independent (Device/Increase time) Sit to supine: Min assist      Transfers Overall transfer level: Modified independent Equipment used: Rolling walker (2 wheeled) Transfers: Sit to/from Stand Sit to Stand: Modified independent (Device/Increase time)         General transfer comment: from bed, commode  Ambulation/Gait Ambulation/Gait  assistance: Supervision Ambulation Distance (Feet): 90 Feet Assistive device: Rolling walker (2 wheeled) Gait Pattern/deviations: WFL(Within Functional Limits)   Gait velocity interpretation: <1.8 ft/sec, indicative of risk for recurrent falls General Gait Details: gait pattern is more antalgic today but still stable and very functional   Stairs Stairs:  (unable work on steps due to symptoms of hypotension)          Wheelchair Mobility    Modified Rankin (Stroke Patients Only)       Balance Overall balance assessment: No apparent balance deficits (not formally assessed)                                  Cognition Arousal/Alertness: Awake/alert Behavior During Therapy: WFL for tasks assessed/performed Overall Cognitive Status: Within Functional Limits for tasks assessed                      Exercises Total Joint Exercises Ankle Circles/Pumps: AROM;15 reps;Supine;Both Quad Sets: AROM;Right;15 reps;Supine;Strengthening Heel Slides: AAROM;Strengthening;Right;15 reps;Supine Hip ABduction/ADduction: AAROM;Strengthening;Right;15 reps;Supine Straight Leg Raises: AROM;Strengthening;Right;15 reps;Supine Goniometric ROM: 8-110 degrees, active right knee    General Comments        Pertinent Vitals/Pain Pain Assessment: No/denies pain Pain Score:  (at rest)    Home Living                      Prior Function  PT Goals (current goals can now be found in the care plan section) Progress towards PT goals: Progressing toward goals    Frequency  BID    PT Plan Current plan remains appropriate    Co-evaluation             End of Session Equipment Utilized During Treatment: Gait belt Activity Tolerance: Patient tolerated treatment well;No increased pain Patient left: in bed;with call bell/phone within reach     Time: 0851-0937 PT Time Calculation (min) (ACUTE ONLY): 46 min  Charges:  $Gait Training: 8-22  mins $Therapeutic Exercise: 8-22 mins $Self Care/Home Management: 8-22                    G CodesSable Feil  PT 01/03/2015, 9:47 AM [phone]

## 2015-01-03 NOTE — Progress Notes (Signed)
Patients blood pressure 89/41. Spoke with Dr Aline Brochure received an order for 565ml Normal Saline Bolus to be given to patient.

## 2015-01-03 NOTE — Care Management Note (Signed)
Case Management Note  Patient Details  Name: Erica Davis MRN: [id number] Date of Birth: 01/20/1956  Subjective/Objective:                    Action/Plan:   Expected Discharge Date:                  Expected Discharge Plan:  [place]  In-House Referral:  NA  Discharge planning Services  CM Consult  Post Acute Care Choice:  Durable Medical Equipment, Home Health Choice offered to:  Patient  DME Arranged:  CPM DME Agency:  [facility]  HH Arranged:  PT HH Agency:  [facility] [place]  Status of Service:  Completed, signed off  Medicare Important Message Given:    Date Medicare IM Given:    Medicare IM give by:    Date Additional Medicare IM Given:    Additional Medicare Important Message give by:     If discussed at [place] of Stay Meetings, dates discussed:    Additional Comments: Pt discharged home today with [facility] PT. Orders faxed to [facility] and they will start care of service on 01/04/15. CPM to be delivered to pt this pm. Pt and pts nurse aware of discharge arrangements. Christinia Gully [place], RN 01/03/2015, 4:13 PM

## 2015-01-03 NOTE — Discharge Summary (Signed)
Physician Discharge Summary  Patient ID: Erica Davis MRN: [id number] DOB/AGE: September 11, 1955 59 y.o.  Admit date: 01/01/2015 Discharge date: 01/03/2015  Admission Diagnoses: Osteoarthritis right knee  Discharge Diagnoses: Osteoarthritis right knee Active Problems:   Primary osteoarthritis of right knee   Discharged Condition: stable  Hospital Course:  On August 17 she had an uncomplicated right total knee arthroplasty with spinal anesthesia. Implants [place] stigma fixed-bearing posterior stabilized knee system August 18 she started physical therapy and did well On August 19 she had an episode of low blood pressure but responded well to 500 mL bolus of fluid was able to ambulate greater than 100 feet without any problems    Discharge Exam: Blood pressure 112/59, pulse 71, temperature 98 F (36.7 C), temperature source Oral, resp. rate 20, height 5' 4.5" (1.638 m), weight 198 lb (89.812 kg), SpO2 97 %. Awake alert oriented 3 neurovascular exam intact drain removed.  Disposition: Final discharge disposition not confirmed  Discharge Instructions    Ambulatory referral to Home Health    Complete by:  As directed   Please evaluate RENETTE Davis for admission to [facility].  Disciplines requested: Physical Therapy  Services to provide: Strengthening Exercises  Physician to follow patient's care (the person listed here will be responsible for signing ongoing orders): Referring Provider  Requested Start of Care Date: Tomorrow  I certify that this patient is under my care and that I, or a Nurse Practitioner or Physician's Assistant working with me, had a face-to-face encounter that meets the physician face-to-face requirements with patient on 8.19.16. The encounter with the patient was in whole, or in part for the following medical condition(s) which is the primary reason for home health care (List medical condition). Right TKA   Special Instructions:  NONE  Does the patient have  Medicare or Medicaid?:  No  The encounter with the patient was in whole, or in part, for the following medical condition, which is the primary reason for home health care:  right knee arthritis  Reason for Medically [place]:  Therapy- Personnel officer, Public librarian  My clinical findings support the need for the above services:  Unable to leave home safely without assistance and/or assistive device  I certify that, based on my findings, the following services are medically necessary home health services:  Physical therapy  Further, I certify that my clinical findings support that this patient is homebound due to:  Ambulates short distances less than 300 feet     CPM    Complete by:  As directed   Continuous passive motion machine (CPM):      Use the CPM from 0 to 70 for 8 hours per day.      You may increase by 10 per day.  You may break it up into 2 or 3 sessions per day.      Use CPM for 3 weeks or until you are told to stop.     Call MD / Call 911    Complete by:  As directed   If you experience chest pain or shortness of breath, CALL 911 and be transported to the hospital emergency room.  If you develope a fever above 101 F, pus (white drainage) or increased drainage or redness at the wound, or calf pain, call your surgeon's office.     Change dressing    Complete by:  As directed   DO NOT Change dressing     Constipation Prevention  Complete by:  As directed   Drink plenty of fluids.  Prune juice may be helpful.  You may use a stool softener, such as Colace (over the counter) 100 mg twice a day.  Use MiraLax (over the counter) for constipation as needed.     Diet - low sodium heart healthy    Complete by:  As directed      Do not put a pillow under the knee. Place it under the heel.    Complete by:  As directed      Increase activity slowly as tolerated    Complete by:  As directed      TED hose    Complete by:  As directed   Use  stockings (TED hose) for 2 weeks on BOTH leg(s).  You may remove them at night for sleeping.            Medication List    TAKE these medications        aspirin 325 MG EC tablet  Take 1 tablet (325 mg total) by mouth 2 (two) times daily.     cholecalciferol 1000 UNITS tablet  Commonly known as:  VITAMIN D  Take by mouth daily. Takes 3000 units daily     Co Q 10 100 MG Caps  Take by mouth daily.     CULTURELLE PO  Take 1 tablet by mouth 4 (four) times daily.     FISH OIL PO  Take by mouth 2 (two) times daily.     HYDROcodone-acetaminophen 5-325 MG per tablet  Commonly known as:  NORCO/VICODIN  Take 1 tablet by mouth every 4 (four) hours as needed for moderate pain.     loratadine 10 MG tablet  Commonly known as:  CLARITIN  Take 10 mg by mouth daily.     multivitamin tablet  Take 1 tablet by mouth daily.     polyethylene glycol packet  Commonly known as:  MIRALAX / GLYCOLAX  Take 17 g by mouth daily.     vitamin C 250 MG tablet  Commonly known as:  ASCORBIC ACID  Take 250 mg by mouth daily.     vitamin E 400 UNIT capsule  Take 400 Units by mouth 2 (two) times daily.           Follow-up Information    Follow up with [facility].   Contact information:   [address] [place] [address] [phone]       Follow up with Arther Abbott, MD.   Specialties:  Orthopedic Surgery, Radiology   Contact information:   [address] [address] [phone]       Signed: Arther Abbott 01/03/2015, 3:53 PM

## 2015-01-03 NOTE — Progress Notes (Signed)
Patient given all discharge instructions.  Patient verbalized understanding.  Patient discharged home.

## 2015-01-06 LAB — TYPE AND SCREEN
ABO/RH(D): A POS
Antibody Screen: NEGATIVE
Unit division: 0
Unit division: 0

## 2015-01-07 ENCOUNTER — Telehealth: Payer: Self-pay | Admitting: Orthopedic Surgery

## 2015-01-07 NOTE — Telephone Encounter (Signed)
Gave verbal ok to [employer]

## 2015-01-07 NOTE — Telephone Encounter (Signed)
Call received from [facility], per Minna Merritts, requesting verbal orders for home therapy per total knee protocol - "1x a week for 1 week, then 3x a week for 1 week, then 1x a week before discharging to out-patient."  Her direct ph# is [phone].

## 2015-01-10 ENCOUNTER — Other Ambulatory Visit: Payer: Self-pay | Admitting: *Deleted

## 2015-01-10 DIAGNOSIS — Z96651 Presence of right artificial knee joint: Secondary | ICD-10-CM

## 2015-01-13 ENCOUNTER — Ambulatory Visit (INDEPENDENT_AMBULATORY_CARE_PROVIDER_SITE_OTHER): Payer: PRIVATE HEALTH INSURANCE | Admitting: Orthopedic Surgery

## 2015-01-13 ENCOUNTER — Encounter: Payer: Self-pay | Admitting: Orthopedic Surgery

## 2015-01-13 VITALS — BP 141/84 | Ht 64.5 in | Wt 198.0 lb

## 2015-01-13 DIAGNOSIS — Z4789 Encounter for other orthopedic aftercare: Secondary | ICD-10-CM

## 2015-01-13 DIAGNOSIS — Z96651 Presence of right artificial knee joint: Secondary | ICD-10-CM

## 2015-01-13 NOTE — Patient Instructions (Signed)
You can drive now  Keep steri strips on for a week

## 2015-01-13 NOTE — Progress Notes (Signed)
Postop visit #1 status post right total knee arthroplasty approximately 2 weeks ago. The patient is doing well she is transferring to outpatient therapy tomorrow. Her pain medicine is Norco 5 mg she uses a half a tablet on a when necessary basis and primarily wants to use Tylenol. She is walking with a cane. Her knee flexion is approximately 90  Her staples were removed today and replaced with Steri-Strips  She can shower over the Steri-Strips. She can remove them in 1 week and then start bathing if she wants to. It is okay for her to drive a she had excellent quadriceps control  Follow-up in 4 weeks

## 2015-01-14 ENCOUNTER — Ambulatory Visit (HOSPITAL_COMMUNITY): Payer: PRIVATE HEALTH INSURANCE | Attending: Orthopedic Surgery | Admitting: Physical Therapy

## 2015-01-14 DIAGNOSIS — M25661 Stiffness of right knee, not elsewhere classified: Secondary | ICD-10-CM

## 2015-01-14 DIAGNOSIS — M25561 Pain in right knee: Secondary | ICD-10-CM | POA: Diagnosis present

## 2015-01-14 DIAGNOSIS — R262 Difficulty in walking, not elsewhere classified: Secondary | ICD-10-CM

## 2015-01-14 DIAGNOSIS — R293 Abnormal posture: Secondary | ICD-10-CM | POA: Diagnosis present

## 2015-01-14 DIAGNOSIS — R609 Edema, unspecified: Secondary | ICD-10-CM | POA: Insufficient documentation

## 2015-01-14 DIAGNOSIS — R531 Weakness: Secondary | ICD-10-CM | POA: Insufficient documentation

## 2015-01-14 DIAGNOSIS — Z96651 Presence of right artificial knee joint: Secondary | ICD-10-CM | POA: Insufficient documentation

## 2015-01-14 NOTE — Therapy (Signed)
[place], [place], [address] Phone: [phone]   Fax:  [phone]  Physical Therapy Evaluation  Patient Details  Name: Erica Davis MRN: [id number] Date of Birth: [birth year] Referring Provider:  Carole Civil, MD  Encounter Date: 01/14/2015      PT End of Session - 01/14/15 1208    Visit Number 1   Number of Visits 18   Date for PT Re-Evaluation 02/11/15   Authorization Type [employer]    Authorization Time Period 01/14/15 to 03/16/15   Authorization - Visit Number 1   Authorization - Number of Visits 10   PT Start Time 1103   PT Stop Time 1141   PT Time Calculation (min) 38 min   Activity Tolerance Patient tolerated treatment well   Behavior During Therapy [facility] for tasks assessed/performed      Past Medical History  Diagnosis Date  . Elevated BP 03/19/2013    Past Surgical History  Procedure Laterality Date  . Cesarean section  1978  . Total knee arthroplasty Right 01/01/2015    Procedure: RIGHT TOTAL KNEE REPLACEMENT;  Surgeon: Carole Civil, MD;  Location: [facility];  Service: Orthopedics;  Laterality: Right;    There were no vitals filed for this visit.  Visit Diagnosis:  Status post total right knee replacement - Plan: PT plan of care cert/re-cert  General weakness - Plan: PT plan of care cert/re-cert  Poor posture - Plan: PT plan of care cert/re-cert  Difficulty walking - Plan: PT plan of care cert/re-cert  Stiffness of right knee - Plan: PT plan of care cert/re-cert  Right knee pain - Plan: PT plan of care cert/re-cert  Edema - Plan: PT plan of care cert/re-cert      Subjective Assessment - 01/14/15 1106    Subjective Patient reports that her knee does get stiff and has been painful recently; has been walking with her cane since yesterday.    Pertinent History Patient had a total knee replacement on August 17th at [place] and was discharged  yesterday. 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 Reports she has been keeping up with some exercises for her leg.    How long can you sit comfortably? 10 minutes; becomes unbearable at 30 minutes    How long can you stand comfortably? 10 minutes    How long can you walk comfortably? Walking does not hurt as much as everything else   Patient Stated Goals improve flexibility, be able to use leg properly, reduce pain    Currently in Pain? Yes   Pain Score 2    Pain Location Knee   Pain Orientation Right            [facility] PT Assessment - 01/14/15 0001    Assessment   Medical Diagnosis s/p R TKR    Onset Date/Surgical Date 01/01/15   Next MD Visit September 27th with Dr. Aline Brochure    Precautions   Precautions Knee   Precaution Comments total knee    Restrictions   Weight Bearing Restrictions No   Balance Screen   Has the patient fallen in the past 6 months No   Has the patient had a decrease in activity level because of a fear of falling?  Yes   Is the patient reluctant to leave their home because of a fear of falling?  No   Prior Function   Level of Independence Independent;Independent with basic ADLs;Independent with gait;Independent  with transfers   Vocation Unemployed   Vocation Requirements babysits, tends to relatives, etc; on her feet quite a bit    Observation/Other Assessments   Observations incision appear well healing but still has steri-strips in place; some heat and edema but no signs of acute inflammation noted    Focus on Therapeutic Outcomes (FOTO)  65% limited    Posture/Postural Control   Posture Comments flexed at hips, forward head with B IR shoulders, reduced weight bearing R LE, R LE flexion   AROM   Right Knee Extension 6   Right Knee Flexion 113   Strength   Right Hip Flexion 2+/5   Right Hip Extension 2/5   Right Hip ABduction 2/5   Left Hip Flexion 3-/5   Left Hip Extension 2+/5   Left Hip ABduction 2+/5   Right Knee Flexion 2/5   Right Knee Extension 3-/5    Left Knee Flexion 3+/5   Left Knee Extension 4-/5   Right Ankle Dorsiflexion 3+/5   Left Ankle Dorsiflexion 4/5   Ambulation/Gait   Ambulation Distance (Feet) 675 Feet   Assistive device Straight cane   Gait Pattern Step-through pattern   Ambulation Surface Level   Stairs Assistance 5: Supervision   Stair Management Technique Two rails;Alternating pattern;Forwards   Number of Stairs 7   Height of Stairs 4   Gait Comments reduced TKE R, reduced stance time R/step length L, flexed at hips, reduced weight bearing R, reduced gait speed, reduced rotation of trunk and pelvis; some knee instability and muscle weakness noted on stairs especially stair descent                            PT Education - 01/14/15 1207    Education provided Yes   Education Details prognosis, plan of care moving forward; advised patient to continue with CPM and extensive exercise program that had already been assigned by HHPT    Person(s) Educated Patient   Methods Explanation   Comprehension Verbalized understanding          PT Short Term Goals - 01/14/15 1213    PT SHORT TERM GOAL #1   Title Patient will improve R knee ROM to 0 degrees extension and at least 120 degrees flexion   Time 3   Period Weeks   Status New   PT SHORT TERM GOAL #2   Title Patient will be able to ambulate at least 30 minutes without assistive device and pain no more than 1/10 R knee    Time 3   Period Weeks   Status New   PT SHORT TERM GOAL #3   Title Patient will be able to verbalize and demonstrate appropriate techniques for edema managment independently    Time 3   Period Weeks   Status New   PT SHORT TERM GOAL #4   Title Patient will be independent in correctly and consistently performing an appropriate advanced HEP, to be updated PRN    Time 3   Period Weeks   Status New           PT Long Term Goals - 01/14/15 1303    PT LONG TERM GOAL #1   Title Patient will demonstrate R knee extension  of 0 degrees and R knee flexion of 130 degrees, 0/10   Time 6   Period Weeks   Status New   PT LONG TERM GOAL #2   Title Patient will be able  to ambulate unlimited distances over level and uneven surfaces with no assistive device and pain R knee no more than 1/10   Time 6   Period Weeks   Status New   PT LONG TERM GOAL #3   Title Patient will be able to ascend and descend full flight of stairs with U railing, step over step pattern, no unsteadiness, and no circumduction R LE, R knee pain no more than 1/10   Time 6   Period Weeks   Status New   PT LONG TERM GOAL #4   Title Patient will be able to squat and lift a load of at least 30 pounds with R knee pain no more than 1/10 in order to assist her in 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 At this time patient will benefit from skilled PT services in order to address her impairments and to assist her in reaching an optimal level of function.    Pt will benefit from skilled therapeutic intervention in order to improve on the following deficits Abnormal gait;Decreased endurance;Hypomobility;Decreased scar mobility;Increased edema;Decreased activity tolerance;Decreased strength;Pain;Decreased balance;Decreased mobility;Difficulty walking;Improper body mechanics;Decreased coordination;Postural dysfunction;Impaired flexibility   Rehab Potential Excellent   PT Frequency 3x / week   PT Duration 6 weeks   PT Treatment/Interventions ADLs/Self Care Home Management;Cryotherapy;DME Instruction;Gait training;Stair training;Functional mobility training;Therapeutic activities;Therapeutic  exercise;Balance training;Neuromuscular re-education;Patient/family education;Manual techniques;Scar mobilization   PT Next Visit Plan review initial eval and goals; functional strengthening and mobilty based exercises, functional stretching    PT Home Exercise Plan patient already has extensive HEP from [place] and Agree with Plan of Care Patient          G-Codes - 01/15/2015 1307    Functional Assessment Tool Used FOTO 65% limited    Functional Limitation Mobility: Walking and moving around   Mobility: Walking and Moving Around Current Status [phone]) At least 60 percent but less than 80 percent impaired, limited or restricted   Mobility: Walking and Moving Around Goal Status [phone]) At least 40 percent but less than 60 percent impaired, limited or restricted       Problem List Patient Active Problem List   Diagnosis Date Noted  . Status post total right knee replacement 01/13/2015  . Aftercare following surgery of the musculoskeletal system 01/13/2015  . Primary osteoarthritis of right knee 01/01/2015  . Elevated BP 03/19/2013    Deniece Ree PT, DPT [place] [address] [place], [place], [address] Phone: [phone]   Fax:  [phone]

## 2015-01-17 ENCOUNTER — Ambulatory Visit (HOSPITAL_COMMUNITY): Payer: PRIVATE HEALTH INSURANCE | Attending: Orthopedic Surgery

## 2015-01-17 ENCOUNTER — Encounter (HOSPITAL_COMMUNITY): Payer: Self-pay

## 2015-01-17 DIAGNOSIS — R531 Weakness: Secondary | ICD-10-CM | POA: Insufficient documentation

## 2015-01-17 DIAGNOSIS — R609 Edema, unspecified: Secondary | ICD-10-CM | POA: Insufficient documentation

## 2015-01-17 DIAGNOSIS — M25661 Stiffness of right knee, not elsewhere classified: Secondary | ICD-10-CM | POA: Diagnosis present

## 2015-01-17 DIAGNOSIS — R293 Abnormal posture: Secondary | ICD-10-CM | POA: Diagnosis present

## 2015-01-17 DIAGNOSIS — Z96651 Presence of right artificial knee joint: Secondary | ICD-10-CM | POA: Diagnosis not present

## 2015-01-17 DIAGNOSIS — M25561 Pain in right knee: Secondary | ICD-10-CM | POA: Insufficient documentation

## 2015-01-17 DIAGNOSIS — R262 Difficulty in walking, not elsewhere classified: Secondary | ICD-10-CM | POA: Insufficient documentation

## 2015-01-17 NOTE — Patient Instructions (Signed)
Continues with HEP as indicated previously; add modifications shown today.

## 2015-01-17 NOTE — Therapy (Signed)
[place], [place], [address] Phone: [phone]   Fax:  [phone]  Physical Therapy Treatment  Patient Details  Name: Erica Davis MRN: [id number] Date of Birth: 1955/08/20 Referring Provider:  Carole Civil, MD  Encounter Date: 01/17/2015      PT End of Session - 01/17/15 1225    Visit Number 2   Number of Visits 18   Date for PT Re-Evaluation 02/11/15   Authorization Type [employer]    Authorization Time Period 01/14/15 to 03/16/15   Authorization - Visit Number 2   Authorization - Number of Visits 10   PT Start Time 1107   PT Stop Time 1148   PT Time Calculation (min) 41 min   Activity Tolerance Patient tolerated treatment well   Behavior During Therapy [facility] for tasks assessed/performed      Past Medical History  Diagnosis Date  . Elevated BP 03/19/2013    Past Surgical History  Procedure Laterality Date  . Cesarean section  1978  . Total knee arthroplasty Right 01/01/2015    Procedure: RIGHT TOTAL KNEE REPLACEMENT;  Surgeon: Carole Civil, MD;  Location: [facility];  Service: Orthopedics;  Laterality: Right;    There were no vitals filed for this visit.  Visit Diagnosis:  Status post total right knee replacement  General weakness  Poor posture  Difficulty walking  Stiffness of right knee  Right knee pain  Edema      Subjective Assessment - 01/17/15 1223    Subjective Pt reports everything is going well, as she continues to use her [facility] for ambulation, and working on her home program staerted with HHPT.    Pertinent History Patient had a total knee replacement on August 17th at [place] and was discharged yesterday. 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 Reports she has been keeping up with some exercises for her leg.    Currently in Pain? Yes   Pain Score 2    Pain Location Knee   Pain Orientation Right   Pain Descriptors / Indicators Aching   Pain Type  Surgical pain                         [facility] Adult PT Treatment/Exercise - 01/17/15 0001    Neuro Re-ed    Neuro Re-ed Details  tandem walking 2x36'; retroamb 2x36''   Knee/Hip Exercises: Standing   Lateral Step Up Right;2 sets;10 reps;Step Height: 2"   Forward Step Up 2 sets;10 reps;Step Height: 2";Right   Knee/Hip Exercises: Seated   Long Arc Quad 2 sets;10 reps;Right;Strengthening;Weights  5#   Long Arc Quad Weight 5 lbs.   Marching Right;AROM;Strengthening;2 sets;10 reps;Weights   Marching Weights 5 lbs.   Knee/Hip Exercises: Supine   Quad Sets 15 reps;Right;Strengthening  3sec hold; alternating heel slides   Short Arc Quad Sets 2 sets;10 reps;AROM  3# around shoe   Short Arc Quad Sets Limitations 3# around shoe   Heel Slides AAROM;Right;15 reps;1 set  3 sec hold, alteranting quad sets   Bridges AROM;Strengthening;Both;2 sets;10 reps   Other Supine Knee/Hip Exercises Clamshelll, knees at 90*  GreenTB 2x10 bilat   Manual Therapy   Manual Therapy Joint mobilization   Joint Mobilization Patella Mobs  60s caudal/cranial, 60s med/lat                PT Education - 01/17/15 1225    Education provided No  PT Short Term Goals - 01/14/15 1213    PT SHORT TERM GOAL #1   Title Patient will improve R knee ROM to 0 degrees extension and at least 120 degrees flexion   Time 3   Period Weeks   Status New   PT SHORT TERM GOAL #2   Title Patient will be able to ambulate at least 30 minutes without assistive device and pain no more than 1/10 R knee    Time 3   Period Weeks   Status New   PT SHORT TERM GOAL #3   Title Patient will be able to verbalize and demonstrate appropriate techniques for edema managment independently    Time 3   Period Weeks   Status New   PT SHORT TERM GOAL #4   Title Patient will be independent in correctly and consistently performing an appropriate advanced HEP, to be updated PRN    Time 3   Period Weeks   Status New            PT Long Term Goals - 01/14/15 1303    PT LONG TERM GOAL #1   Title Patient will demonstrate R knee extension of 0 degrees and R knee flexion of 130 degrees, 0/10   Time 6   Period Weeks   Status New   PT LONG TERM GOAL #2   Title Patient will be able to ambulate unlimited distances over level and uneven surfaces with no assistive device and pain R knee no more than 1/10   Time 6   Period Weeks   Status New   PT LONG TERM GOAL #3   Title Patient will be able to ascend and descend full flight of stairs with U railing, step over step pattern, no unsteadiness, and no circumduction R LE, R knee pain no more than 1/10   Time 6   Period Weeks   Status New   PT LONG TERM GOAL #4   Title Patient will be able to squat and lift a load of at least 30 pounds with R knee pain no more than 1/10 in order to assist her in functional baby-sitting tasks    Time 6   Period Weeks   Status New               Plan - 01/17/15 1226    Clinical Impression Statement Pt continues to make progress in ROM, strength, and HEP indep on RLE. Pt's greatest limitor remains balance impairments on RLE and realted instability and poor confidence with mobility tasks.    Pt will benefit from skilled therapeutic intervention in order to improve on the following deficits Abnormal gait;Decreased endurance;Hypomobility;Decreased scar mobility;Increased edema;Decreased activity tolerance;Decreased strength;Pain;Decreased balance;Decreased mobility;Difficulty walking;Improper body mechanics;Decreased coordination;Postural dysfunction;Impaired flexibility   Rehab Potential Excellent   PT Frequency 3x / week   PT Duration 6 weeks   PT Treatment/Interventions ADLs/Self Care Home Management;Cryotherapy;DME Instruction;Gait training;Stair training;Functional mobility training;Therapeutic activities;Therapeutic exercise;Balance training;Neuromuscular re-education;Patient/family education;Manual techniques;Scar  mobilization   PT Next Visit Plan review initial eval and goals; functional strengthening and mobilty based exercises, functional stretching    PT Home Exercise Plan patient already has extensive HEP from HHPT, modified today slightly.    Consulted and Agree with Plan of Care Patient        Problem List Patient Active Problem List   Diagnosis Date Noted  . Status post total right knee replacement 01/13/2015  . Aftercare following surgery of the musculoskeletal system 01/13/2015  . Primary osteoarthritis of right  knee 01/01/2015  . Elevated BP 03/19/2013    Shontell Prosser C 01/17/2015, 12:29 PM  12:29 PM  Etta Grandchild, PT, DPT [place] License # [id number]        [facility] [address] [place], [place], [address] Phone: [phone]   Fax:  [phone]

## 2015-01-21 ENCOUNTER — Ambulatory Visit (HOSPITAL_COMMUNITY): Payer: PRIVATE HEALTH INSURANCE | Admitting: Physical Therapy

## 2015-01-21 DIAGNOSIS — M25661 Stiffness of right knee, not elsewhere classified: Secondary | ICD-10-CM

## 2015-01-21 DIAGNOSIS — M25561 Pain in right knee: Secondary | ICD-10-CM

## 2015-01-21 DIAGNOSIS — R293 Abnormal posture: Secondary | ICD-10-CM

## 2015-01-21 DIAGNOSIS — Z96651 Presence of right artificial knee joint: Secondary | ICD-10-CM | POA: Diagnosis not present

## 2015-01-21 DIAGNOSIS — R531 Weakness: Secondary | ICD-10-CM

## 2015-01-21 DIAGNOSIS — R609 Edema, unspecified: Secondary | ICD-10-CM

## 2015-01-21 DIAGNOSIS — R262 Difficulty in walking, not elsewhere classified: Secondary | ICD-10-CM

## 2015-01-21 NOTE — Therapy (Signed)
[place], [place], [address] Phone: [phone]   Fax:  [phone]  Physical Therapy Treatment  Patient Details  Name: Erica Davis MRN: [id number] Date of Birth: 1955-10-25 Referring Provider:  Carole Civil, MD  Encounter Date: 01/21/2015      PT End of Session - 01/21/15 0917    Visit Number 3   Number of Visits 18   Date for PT Re-Evaluation 02/11/15   Authorization Type [employer]    Authorization Time Period 01/14/15 to 03/16/15   Authorization - Visit Number 3   Authorization - Number of Visits 10   PT Start Time 0840   PT Stop Time 0930   PT Time Calculation (min) 50 min      Past Medical History  Diagnosis Date  . Elevated BP 03/19/2013    Past Surgical History  Procedure Laterality Date  . Cesarean section  1978  . Total knee arthroplasty Right 01/01/2015    Procedure: RIGHT TOTAL KNEE REPLACEMENT;  Surgeon: Carole Civil, MD;  Location: [facility];  Service: Orthopedics;  Laterality: Right;    There were no vitals filed for this visit.  Visit Diagnosis:  Status post total right knee replacement  General weakness  Poor posture  Difficulty walking  Stiffness of right knee  Right knee pain  Edema      Subjective Assessment - 01/21/15 0841    Subjective Patient states at home she does not use her cane all the time when she is in her home.  Pt encouraged to wean away from cane.    Currently in Pain? Yes   Pain Score 1    Pain Location Knee   Pain Orientation Right               [facility] Adult PT Treatment/Exercise - 01/21/15 0001    Exercises   Exercises Knee/Hip   Knee/Hip Exercises: Stretches   Knee: Self-Stretch to increase Flexion Right;3 reps;30 seconds   Knee: Self-Stretch Limitations on 15" step    Knee/Hip Exercises: Standing   Heel Raises 15 reps   Knee Flexion Strengthening;Right;15 reps   Knee Flexion Limitations 4#    Lateral Step Up 15 reps   Forward Step Up 15 reps   Functional Squat 15 reps   SLS x5  max 5"    Knee/Hip Exercises: Seated   Sit to Sand 10 reps   Knee/Hip Exercises: Supine   Quad Sets 15 reps   Quad Sets Limitations 3   Heel Slides 15 reps   Heel Slides Limitations 120   Terminal Knee Extension 15 reps   Knee/Hip Exercises: Prone   Hamstring Curl 15 reps   Hamstring Curl Limitations 4#   Hip Extension 15 reps                PT Education - 01/21/15 0917    Education provided Yes   Education Details complete SLS everyday in HEP   Methods Explanation;Demonstration   Comprehension Verbalized understanding;Returned demonstration          PT Short Term Goals - 01/14/15 1213    PT SHORT TERM GOAL #1   Title Patient will improve R knee ROM to 0 degrees extension and at least 120 degrees flexion   Time 3   Period Weeks   Status New   PT SHORT TERM GOAL #2   Title Patient will be able to ambulate at least 30 minutes without assistive device and pain no  more than 1/10 R knee    Time 3   Period Weeks   Status New   PT SHORT TERM GOAL #3   Title Patient will be able to verbalize and demonstrate appropriate techniques for edema managment independently    Time 3   Period Weeks   Status New   PT SHORT TERM GOAL #4   Title Patient will be independent in correctly and consistently performing an appropriate advanced HEP, to be updated PRN    Time 3   Period Weeks   Status New           PT Long Term Goals - 01/14/15 1303    PT LONG TERM GOAL #1   Title Patient will demonstrate R knee extension of 0 degrees and R knee flexion of 130 degrees, 0/10   Time 6   Period Weeks   Status New   PT LONG TERM GOAL #2   Title Patient will be able to ambulate unlimited distances over level and uneven surfaces with no assistive device and pain R knee no more than 1/10   Time 6   Period Weeks   Status New   PT LONG TERM GOAL #3   Title Patient will be able to ascend and descend full flight of stairs  with U railing, step over step pattern, no unsteadiness, and no circumduction R LE, R knee pain no more than 1/10   Time 6   Period Weeks   Status New   PT LONG TERM GOAL #4   Title Patient will be able to squat and lift a load of at least 30 pounds with R knee pain no more than 1/10 in order to assist her in functional baby-sitting tasks    Time 6   Period Weeks   Status New               Plan - 01/21/15 0919    Clinical Impression Statement Pt continues to improve her main limitation at this time is balance and functionall strength.  Added sit to stand and prone exercises  to imporve strength.   PT Next Visit Plan Pt to begin lunges and stairs next treatment.    Consulted and Agree with Plan of Care Patient        Problem List Patient Active Problem List   Diagnosis Date Noted  . Status post total right knee replacement 01/13/2015  . Aftercare following surgery of the musculoskeletal system 01/13/2015  . Primary osteoarthritis of right knee 01/01/2015  . Elevated BP 03/19/2013   Rayetta Humphrey, PT CLT [phone] 01/21/2015, 9:25 AM  [place], [place], [address] Phone: [phone]   Fax:  [phone]

## 2015-01-22 ENCOUNTER — Ambulatory Visit (HOSPITAL_COMMUNITY): Payer: PRIVATE HEALTH INSURANCE | Admitting: Physical Therapy

## 2015-01-22 DIAGNOSIS — R293 Abnormal posture: Secondary | ICD-10-CM

## 2015-01-22 DIAGNOSIS — R609 Edema, unspecified: Secondary | ICD-10-CM

## 2015-01-22 DIAGNOSIS — M25561 Pain in right knee: Secondary | ICD-10-CM

## 2015-01-22 DIAGNOSIS — R262 Difficulty in walking, not elsewhere classified: Secondary | ICD-10-CM

## 2015-01-22 DIAGNOSIS — M25661 Stiffness of right knee, not elsewhere classified: Secondary | ICD-10-CM

## 2015-01-22 DIAGNOSIS — R531 Weakness: Secondary | ICD-10-CM

## 2015-01-22 DIAGNOSIS — Z96651 Presence of right artificial knee joint: Secondary | ICD-10-CM | POA: Diagnosis not present

## 2015-01-22 NOTE — Therapy (Signed)
[place], [place], [address] Phone: [phone]   Fax:  [phone]  Physical Therapy Treatment  Patient Details  Name: Erica Davis MRN: [id number] Date of Birth: [birth year] Referring Provider:  Carole Civil, MD  Encounter Date: 01/22/2015      PT End of Session - 01/22/15 1143    Visit Number 4   Number of Visits 18   Date for PT Re-Evaluation 02/11/15   Authorization Type [employer]    Authorization Time Period 01/14/15 to 03/16/15   Authorization - Visit Number 4   Authorization - Number of Visits 10   PT Start Time 1107   PT Stop Time 1145   PT Time Calculation (min) 38 min   Activity Tolerance Patient tolerated treatment well   Behavior During Therapy [facility] for tasks assessed/performed      Past Medical History  Diagnosis Date  . Elevated BP 03/19/2013    Past Surgical History  Procedure Laterality Date  . Cesarean section  1978  . Total knee arthroplasty Right 01/01/2015    Procedure: RIGHT TOTAL KNEE REPLACEMENT;  Surgeon: Carole Civil, MD;  Location: [facility];  Service: Orthopedics;  Laterality: Right;    There were no vitals filed for this visit.  Visit Diagnosis:  Status post total right knee replacement  General weakness  Poor posture  Difficulty walking  Stiffness of right knee  Right knee pain  Edema      Subjective Assessment - 01/21/15 0841    Subjective Patient states at home she does not use her cane all the time when she is in her home.  Pt encouraged to wean away from cane.    Currently in Pain? Yes   Pain Score 1    Pain Location Knee   Pain Orientation Right                         [facility] Adult PT Treatment/Exercise - 01/22/15 1111    Knee/Hip Exercises: Stretches   Knee: Self-Stretch to increase Flexion Right;10 seconds   Knee: Self-Stretch Limitations 10 reps on 14" step   Knee/Hip Exercises: Aerobic   Stationary Bike 8 minutes full  revolutions seat 8   Knee/Hip Exercises: Standing   Heel Raises 15 reps   Heel Raises Limitations toeraises 10 reps   Knee Flexion Both;15 reps   Knee Flexion Limitations 4#    Lateral Step Up Both;10 reps;Step Height: 4";Hand Hold: 1   Lateral Step Up Limitations 4" 1 HHA   Forward Step Up Both;10 reps;Step Height: 4";Hand Hold: 1   Forward Step Up Limitations 4" 1 HHA   Functional Squat 15 reps   Stairs 2RT on 4" side with 1 HHA reciprocally   SLS 30" total max Rt:15", Lt: 5"                 PT Education - 01/21/15 0917    Education provided Yes   Education Details complete SLS everyday in HEP   Methods Explanation;Demonstration   Comprehension Verbalized understanding;Returned demonstration          PT Short Term Goals - 01/14/15 1213    PT SHORT TERM GOAL #1   Title Patient will improve R knee ROM to 0 degrees extension and at least 120 degrees flexion   Time 3   Period Weeks   Status New   PT SHORT TERM GOAL #2   Title Patient will be able  to ambulate at least 30 minutes without assistive device and pain no more than 1/10 R knee    Time 3   Period Weeks   Status New   PT SHORT TERM GOAL #3   Title Patient will be able to verbalize and demonstrate appropriate techniques for edema managment independently    Time 3   Period Weeks   Status New   PT SHORT TERM GOAL #4   Title Patient will be independent in correctly and consistently performing an appropriate advanced HEP, to be updated PRN    Time 3   Period Weeks   Status New           PT Long Term Goals - 01/14/15 1303    PT LONG TERM GOAL #1   Title Patient will demonstrate R knee extension of 0 degrees and R knee flexion of 130 degrees, 0/10   Time 6   Period Weeks   Status New   PT LONG TERM GOAL #2   Title Patient will be able to ambulate unlimited distances over level and uneven surfaces with no assistive device and pain R knee no more than 1/10   Time 6   Period Weeks   Status New   PT  LONG TERM GOAL #3   Title Patient will be able to ascend and descend full flight of stairs with U railing, step over step pattern, no unsteadiness, and no circumduction R LE, R knee pain no more than 1/10   Time 6   Period Weeks   Status New   PT LONG TERM GOAL #4   Title Patient will be able to squat and lift a load of at least 30 pounds with R knee pain no more than 1/10 in order to assist her in functional baby-sitting tasks    Time 6   Period Weeks   Status New               Plan - 01/22/15 1144    Clinical Impression Statement Pt with normal functional ROM in Rt knee.  Now having difficulty with grinding in LT knee.  Overall progressing well with minimal functional limitations but stability decreased.   Advanced to reciprocal steps today with completion with 4" height without use of UE's.  Audible grinding in Lt knee with forward lunges and forward step ups.  suggested patient have MD assess LT knee upon return to clinic.     PT Next Visit Plan Progress functional strength.  Add balance actvities.        Problem List Patient Active Problem List   Diagnosis Date Noted  . Status post total right knee replacement 01/13/2015  . Aftercare following surgery of the musculoskeletal system 01/13/2015  . Primary osteoarthritis of right knee 01/01/2015  . Elevated BP 03/19/2013    Teena Irani, PTA/CLT [phone]  01/22/2015, 11:48 AM  [place] [address] [place], [place], [address] Phone: [phone]   Fax:  [phone]

## 2015-01-24 ENCOUNTER — Ambulatory Visit (HOSPITAL_COMMUNITY): Payer: PRIVATE HEALTH INSURANCE

## 2015-01-24 DIAGNOSIS — M25661 Stiffness of right knee, not elsewhere classified: Secondary | ICD-10-CM

## 2015-01-24 DIAGNOSIS — R293 Abnormal posture: Secondary | ICD-10-CM

## 2015-01-24 DIAGNOSIS — Z96651 Presence of right artificial knee joint: Secondary | ICD-10-CM | POA: Diagnosis not present

## 2015-01-24 DIAGNOSIS — R609 Edema, unspecified: Secondary | ICD-10-CM

## 2015-01-24 DIAGNOSIS — R531 Weakness: Secondary | ICD-10-CM

## 2015-01-24 DIAGNOSIS — R262 Difficulty in walking, not elsewhere classified: Secondary | ICD-10-CM

## 2015-01-24 DIAGNOSIS — M25561 Pain in right knee: Secondary | ICD-10-CM

## 2015-01-24 NOTE — Therapy (Signed)
[place], [place], [address] Phone: [phone]   Fax:  [phone]  Physical Therapy Treatment  Patient Details  Name: TENNILE STYLES MRN: [id number] Date of Birth: [birth year] Referring Provider:  Carole Civil, MD  Encounter Date: 01/24/2015      PT End of Session - 01/24/15 1514    Visit Number 5   Number of Visits 18   Date for PT Re-Evaluation 02/11/15   Authorization Type [employer]    Authorization Time Period 01/14/15 to 03/16/15   Authorization - Visit Number 5   Authorization - Number of Visits 10   PT Start Time 7096   PT Stop Time 1513   PT Time Calculation (min) 41 min   Activity Tolerance Patient tolerated treatment well   Behavior During Therapy [facility] for tasks assessed/performed      Past Medical History  Diagnosis Date  . Elevated BP 03/19/2013    Past Surgical History  Procedure Laterality Date  . Cesarean section  1978  . Total knee arthroplasty Right 01/01/2015    Procedure: RIGHT TOTAL KNEE REPLACEMENT;  Surgeon: Carole Civil, MD;  Location: [facility];  Service: Orthopedics;  Laterality: Right;    There were no vitals filed for this visit.  Visit Diagnosis:  General weakness  Poor posture  Difficulty walking  Stiffness of right knee  Right knee pain  Edema                       [facility] Adult PT Treatment/Exercise - 01/24/15 0001    Ambulation/Gait   Ambulation Distance (Feet) 1287 Feet  (6MWT)   Assistive device None   Gait Pattern Step-through pattern   Ambulation Surface Level;Indoor   Gait Comments Good heel strike bilat, equal timing, step length   Neuro Re-ed    Neuro Re-ed Details  tandem walking 4x36'; retroamb 4x36''   Knee/Hip Exercises: Stretches   Knee: Self-Stretch to increase Flexion Right  10 reps x 10 seconds   Knee: Self-Stretch Limitations 14" step    Knee/Hip Exercises: Standing   Lateral Step Up 2 sets;10 reps;Step Height:  4";Step Height: 2"  (2 inch airex)    Lateral Step Up Limitations 1 HHA   Forward Step Up 2 sets;10 reps;Step Height: 2";Step Height: 4"   Step Down Right;2 sets;10 reps;Step Height: 4";Step Height: 2"  first set on 2", second set on 4"    Functional Squat 15 reps   SLS 3x30sec bilat   Other Standing Knee Exercises Stepping up and over   36x RLE on dynadisc   Other Standing Knee Exercises Narrow stance on airex  trunk rotation c 5000g ball 10x bilat                PT Education - 01/24/15 1618    Education provided No          PT Short Term Goals - 01/14/15 1213    PT SHORT TERM GOAL #1   Title Patient will improve R knee ROM to 0 degrees extension and at least 120 degrees flexion   Time 3   Period Weeks   Status New   PT SHORT TERM GOAL #2   Title Patient will be able to ambulate at least 30 minutes without assistive device and pain no more than 1/10 R knee    Time 3   Period Weeks   Status New   PT SHORT TERM GOAL #3  Title Patient will be able to verbalize and demonstrate appropriate techniques for edema managment independently    Time 3   Period Weeks   Status New   PT SHORT TERM GOAL #4   Title Patient will be independent in correctly and consistently performing an appropriate advanced HEP, to be updated PRN    Time 3   Period Weeks   Status New           PT Long Term Goals - 01/14/15 1303    PT LONG TERM GOAL #1   Title Patient will demonstrate R knee extension of 0 degrees and R knee flexion of 130 degrees, 0/10   Time 6   Period Weeks   Status New   PT LONG TERM GOAL #2   Title Patient will be able to ambulate unlimited distances over level and uneven surfaces with no assistive device and pain R knee no more than 1/10   Time 6   Period Weeks   Status New   PT LONG TERM GOAL #3   Title Patient will be able to ascend and descend full flight of stairs with U railing, step over step pattern, no unsteadiness, and no circumduction R LE, R knee  pain no more than 1/10   Time 6   Period Weeks   Status New   PT LONG TERM GOAL #4   Title Patient will be able to squat and lift a load of at least 30 pounds with R knee pain no more than 1/10 in order to assist her in functional baby-sitting tasks    Time 6   Period Weeks   Status New               Plan - 01/24/15 1515    Clinical Impression Statement Pt continues to demonstrate excellent progress toward goals in strength, ROM, pain management, HEP independence, and ambulation. Pt continues to have difficulty with dynamic stability and eccentric control of R knee and will benefit from continued skilled interention to restore to PLOF.    Pt will benefit from skilled therapeutic intervention in order to improve on the following deficits Abnormal gait;Decreased endurance;Hypomobility;Decreased scar mobility;Increased edema;Decreased activity tolerance;Decreased strength;Pain;Decreased balance;Decreased mobility;Difficulty walking;Improper body mechanics;Decreased coordination;Postural dysfunction;Impaired flexibility   Rehab Potential Excellent   PT Frequency 3x / week   PT Duration 6 weeks   PT Treatment/Interventions ADLs/Self Care Home Management;Cryotherapy;DME Instruction;Gait training;Stair training;Functional mobility training;Therapeutic activities;Therapeutic exercise;Balance training;Neuromuscular re-education;Patient/family education;Manual techniques;Scar mobilization   PT Next Visit Plan Progress functional strength.  Add balance actvities.   PT Home Exercise Plan patient already has extensive HEP from [facility], modified today slightly.    Consulted and Agree with Plan of Care Patient        Problem List Patient Active Problem List   Diagnosis Date Noted  . Status post total right knee replacement 01/13/2015  . Aftercare following surgery of the musculoskeletal system 01/13/2015  . Primary osteoarthritis of right knee 01/01/2015  . Elevated BP 03/19/2013     Zana Biancardi C 01/24/2015, 4:19 PM  4:19 PM  Etta Grandchild, PT, DPT [place] License # [id number]   [facility] [address] [place], [place], [address] Phone: [phone]   Fax:  [phone]

## 2015-01-27 ENCOUNTER — Ambulatory Visit (HOSPITAL_COMMUNITY): Payer: PRIVATE HEALTH INSURANCE

## 2015-01-27 DIAGNOSIS — Z96651 Presence of right artificial knee joint: Secondary | ICD-10-CM

## 2015-01-27 DIAGNOSIS — M25561 Pain in right knee: Secondary | ICD-10-CM

## 2015-01-27 DIAGNOSIS — R531 Weakness: Secondary | ICD-10-CM

## 2015-01-27 DIAGNOSIS — R262 Difficulty in walking, not elsewhere classified: Secondary | ICD-10-CM

## 2015-01-27 DIAGNOSIS — R609 Edema, unspecified: Secondary | ICD-10-CM

## 2015-01-27 DIAGNOSIS — R293 Abnormal posture: Secondary | ICD-10-CM

## 2015-01-27 DIAGNOSIS — M25661 Stiffness of right knee, not elsewhere classified: Secondary | ICD-10-CM

## 2015-01-27 NOTE — Therapy (Signed)
[place], [place], [address] Phone: [phone]   Fax:  [phone]  Physical Therapy Treatment  Patient Details  Name: Erica Davis MRN: [id number] Date of Birth: 19-May-1955 Referring Provider:  Carole Civil, MD  Encounter Date: 01/27/2015      PT End of Session - 01/27/15 1104    Visit Number 6   Number of Visits 18   Date for PT Re-Evaluation 02/11/15   Authorization Type [employer]    Authorization Time Period 01/14/15 to 03/16/15   Authorization - Visit Number 6   Authorization - Number of Visits 10   PT Start Time 1055   PT Stop Time 1152   PT Time Calculation (min) 57 min   Equipment Utilized During Treatment Gait belt   Activity Tolerance Patient tolerated treatment well   Behavior During Therapy [facility] for tasks assessed/performed      Past Medical History  Diagnosis Date  . Elevated BP 03/19/2013    Past Surgical History  Procedure Laterality Date  . Cesarean section  1978  . Total knee arthroplasty Right 01/01/2015    Procedure: RIGHT TOTAL KNEE REPLACEMENT;  Surgeon: Carole Civil, MD;  Location: [facility];  Service: Orthopedics;  Laterality: Right;    There were no vitals filed for this visit.  Visit Diagnosis:  General weakness  Poor posture  Difficulty walking  Stiffness of right knee  Right knee pain  Edema  Status post total right knee replacement      Subjective Assessment - 01/27/15 1057    Subjective Pt stated minimal pain maybe .5/10, mainly soreness.  No longer walks with AD, HEP compliance daily and continues to use CPM daily.   Currently in Pain? Yes   Pain Score 1    Pain Location Knee   Pain Orientation Right   Pain Descriptors / Indicators Sore            [facility] PT Assessment - 01/27/15 0001    Assessment   Medical Diagnosis s/p R TKR    Onset Date/Surgical Date 01/01/15   Next MD Visit Aline Brochure 02/11/2015   AROM   Right Knee Extension 0   Right Knee Flexion 120          [facility] Adult PT Treatment/Exercise - 01/27/15 0001    Exercises   Exercises Knee/Hip   Knee/Hip Exercises: Stretches   Active Hamstring Stretch 3 reps;30 seconds   Active Hamstring Stretch Limitations 14in step   Gastroc Stretch 3 reps;30 seconds   Gastroc Stretch Limitations slant board   Knee/Hip Exercises: Aerobic   Stationary Bike 8 minutes full revolutions seat 7   Knee/Hip Exercises: Standing   Heel Raises 15 reps   Heel Raises Limitations toeraises 15 reps   Lateral Step Up 15 reps;Hand Hold: 2;Step Height: 4"   Forward Step Up Right;15 reps;Hand Hold: 1;Step Height: 6"   Step Down 15 reps;Hand Hold: 1;Step Height: 4"   Functional Squat 15 reps   SLS Rt 31", Lt 13" max of 5   Other Standing Knee Exercises Hurdles 6 and 12in 2RT alternating   Other Standing Knee Exercises balance beam 2Rt tandem and retro; Trunk rotation with blue tball with tandem stance 10x each   Knee/Hip Exercises: Supine   Heel Slides Limitations 0-120   Terminal Knee Extension 15 reps   Terminal Knee Extension Limitations 5" holds            PT Short Term Goals - 01/27/15  [place] #1   Title Patient will improve R knee ROM to 0 degrees extension and at least 120 degrees flexion   Baseline 01/27/2015 AROM 0-120 degrees   Status Achieved   PT SHORT TERM GOAL #2   Title Patient will be able to ambulate at least 30 minutes without assistive device and pain no more than 1/10 R knee    Status On-going   PT SHORT TERM GOAL #3   Title Patient will be able to verbalize and demonstrate appropriate techniques for edema managment independently    Status On-going   PT SHORT TERM GOAL #4   Title Patient will be independent in correctly and consistently performing an appropriate advanced HEP, to be updated PRN    Status Achieved           PT Long Term Goals - 01/27/15 1125    PT LONG TERM GOAL #1   Title Patient will demonstrate R knee extension  of 0 degrees and R knee flexion of 130 degrees, 0/10   Status On-going   PT LONG TERM GOAL #2   Title Patient will be able to ambulate unlimited distances over level and uneven surfaces with no assistive device and pain R knee no more than 1/10   PT LONG TERM GOAL #3   Title Patient will be able to ascend and descend full flight of stairs with U railing, step over step pattern, no unsteadiness, and no circumduction R LE, R knee pain no more than 1/10   PT LONG TERM GOAL #4   Title Patient will be able to squat and lift a load of at least 30 pounds with R knee pain no more than 1/10 in order to assist her in functional baby-sitting tasks                Plan - 01/27/15 1203    Clinical Impression Statement Continued session focus with functional strengthening and progress balance activities with dynamic surfaces.  Pt able to demonstrate appropraite form and techniques following cueing, does continues to demonstrate weak eccentric control descending stairs. Min assistance required for LOB episdoes with NBOS on dynamic surfaces,  No reports of pain through session.     PT Next Visit Plan Progress functional strength and balance activiites, begin sidestepping and continue SLS.        Problem List Patient Active Problem List   Diagnosis Date Noted  . Status post total right knee replacement 01/13/2015  . Aftercare following surgery of the musculoskeletal system 01/13/2015  . Primary osteoarthritis of right knee 01/01/2015  . Elevated BP 03/19/2013   Ihor Austin, LPTA; [place]  Aldona Lento 01/27/2015, 12:48 PM  [place] [address] [place], [place], [address] Phone: [phone]   Fax:  [phone]

## 2015-01-29 ENCOUNTER — Ambulatory Visit (HOSPITAL_COMMUNITY): Payer: PRIVATE HEALTH INSURANCE | Admitting: Physical Therapy

## 2015-01-29 DIAGNOSIS — Z96651 Presence of right artificial knee joint: Secondary | ICD-10-CM

## 2015-01-29 DIAGNOSIS — M25561 Pain in right knee: Secondary | ICD-10-CM

## 2015-01-29 DIAGNOSIS — R609 Edema, unspecified: Secondary | ICD-10-CM

## 2015-01-29 DIAGNOSIS — R262 Difficulty in walking, not elsewhere classified: Secondary | ICD-10-CM

## 2015-01-29 DIAGNOSIS — M25661 Stiffness of right knee, not elsewhere classified: Secondary | ICD-10-CM

## 2015-01-29 DIAGNOSIS — R531 Weakness: Secondary | ICD-10-CM

## 2015-01-29 DIAGNOSIS — R293 Abnormal posture: Secondary | ICD-10-CM

## 2015-01-29 NOTE — Therapy (Signed)
[place], [place], [address] Phone: [phone]   Fax:  [phone]  Physical Therapy Treatment  Patient Details  Name: Erica Davis MRN: [id number] Date of Birth: 31-Jan-1956 Referring Provider:  Carole Civil, MD  Encounter Date: 01/29/2015      PT End of Session - 01/29/15 1137    Visit Number 7   Number of Visits 18   Date for PT Re-Evaluation 02/11/15   Authorization Type [employer]    Authorization Time Period 01/14/15 to 03/16/15   Authorization - Visit Number 7   Authorization - Number of Visits 10   PT Start Time 1100   PT Stop Time 1144   PT Time Calculation (min) 44 min   Activity Tolerance Patient tolerated treatment well   Behavior During Therapy [facility] for tasks assessed/performed      Past Medical History  Diagnosis Date  . Elevated BP 03/19/2013    Past Surgical History  Procedure Laterality Date  . Cesarean section  1978  . Total knee arthroplasty Right 01/01/2015    Procedure: RIGHT TOTAL KNEE REPLACEMENT;  Surgeon: Carole Civil, MD;  Location: [facility];  Service: Orthopedics;  Laterality: Right;    There were no vitals filed for this visit.  Visit Diagnosis:  General weakness  Poor posture  Difficulty walking  Stiffness of right knee  Right knee pain  Edema  Status post total right knee replacement      Subjective Assessment - 01/29/15 1101    Subjective Patient reports only small amounts of pain today, maybe 1/10; states that she has been feeling a little sore after doing HEP    Pertinent History Patient had a total knee replacement on August 17th at [facility] and was discharged yesterday. 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 Reports she has been keeping up with some exercises for her leg.    Currently in Pain? Yes   Pain Score 1    Pain Location Knee   Pain Orientation Right                         [facility] Adult PT  Treatment/Exercise - 01/29/15 0001    Knee/Hip Exercises: Stretches   Active Hamstring Stretch 3 reps;30 seconds   Active Hamstring Stretch Limitations stairs    Quad Stretch Both;3 reps;30 seconds   Quad Stretch Limitations prone stretch    Piriformis Stretch Both;2 reps;20 seconds   Piriformis Stretch Limitations seated   Gastroc Stretch 3 reps;30 seconds   Gastroc Stretch Limitations slant board   Knee/Hip Exercises: Aerobic   Stationary Bike 8 minutes full revolutions seat 7   Knee/Hip Exercises: Standing   Heel Raises Both;1 set;20 reps   Heel Raises Limitations toe and heel raises    Forward Lunges Both;1 set;10 reps   Forward Lunges Limitations 4 inch box    Forward Step Up Both;1 set;15 reps   Forward Step Up Limitations 6 inch box    Rocker Board Limitations x20AP, x20 lateral U HHA    SLS R 55 seconds at best, L 32 seconds at best, solid surface    Other Standing Knee Exercises 3D hip excursions 1x15; hip ABD walks 4x81ft                PT Education - 01/29/15 1137    Education provided Yes   Education Details educated regarding the importance of improving balance in  order to reduce/prevent falls and related morbidity    Person(s) Educated Patient   Methods Explanation   Comprehension Verbalized understanding          PT Short Term Goals - 01/27/15 1122    PT SHORT TERM GOAL #1   Title Patient will improve R knee ROM to 0 degrees extension and at least 120 degrees flexion   Baseline 01/27/2015 AROM 0-120 degrees   Status Achieved   PT SHORT TERM GOAL #2   Title Patient will be able to ambulate at least 30 minutes without assistive device and pain no more than 1/10 R knee    Status On-going   PT SHORT TERM GOAL #3   Title Patient will be able to verbalize and demonstrate appropriate techniques for edema managment independently    Status On-going   PT SHORT TERM GOAL #4   Title Patient will be independent in correctly and consistently performing an  appropriate advanced HEP, to be updated PRN    Status Achieved           PT Long Term Goals - 01/27/15 1125    PT LONG TERM GOAL #1   Title Patient will demonstrate R knee extension of 0 degrees and R knee flexion of 130 degrees, 0/10   Status On-going   PT LONG TERM GOAL #2   Title Patient will be able to ambulate unlimited distances over level and uneven surfaces with no assistive device and pain R knee no more than 1/10   PT LONG TERM GOAL #3   Title Patient will be able to ascend and descend full flight of stairs with U railing, step over step pattern, no unsteadiness, and no circumduction R LE, R knee pain no more than 1/10   PT LONG TERM GOAL #4   Title Patient will be able to squat and lift a load of at least 30 pounds with R knee pain no more than 1/10 in order to assist her in functional baby-sitting tasks                Plan - 01/29/15 1138    Clinical Impression Statement Continued functional stretches, exercises, gait training today. Good form during all exercises but patient did report some fatigue today. Demonstrated improved SLS time on solid surface today.    Pt will benefit from skilled therapeutic intervention in order to improve on the following deficits Abnormal gait;Decreased endurance;Hypomobility;Decreased scar mobility;Increased edema;Decreased activity tolerance;Decreased strength;Pain;Decreased balance;Decreased mobility;Difficulty walking;Improper body mechanics;Decreased coordination;Postural dysfunction;Impaired flexibility   Rehab Potential Excellent   PT Frequency 3x / week   PT Duration 6 weeks   PT Treatment/Interventions ADLs/Self Care Home Management;Cryotherapy;DME Instruction;Gait training;Stair training;Functional mobility training;Therapeutic activities;Therapeutic exercise;Balance training;Neuromuscular re-education;Patient/family education;Manual techniques;Scar mobilization   PT Next Visit Plan Progress functional strength and balance  activiites, continue sidestepping and continue SLS.   PT Home Exercise Plan patient already has extensive HEP from [facility], modified today slightly.    Consulted and Agree with Plan of Care Patient        Problem List Patient Active Problem List   Diagnosis Date Noted  . Status post total right knee replacement 01/13/2015  . Aftercare following surgery of the musculoskeletal system 01/13/2015  . Primary osteoarthritis of right knee 01/01/2015  . Elevated BP 03/19/2013    Deniece Ree PT, DPT [place] [address] [place], [place], [address] Phone: [phone]   Fax:  [phone]

## 2015-01-31 ENCOUNTER — Ambulatory Visit (HOSPITAL_COMMUNITY): Payer: PRIVATE HEALTH INSURANCE | Admitting: Physical Therapy

## 2015-01-31 DIAGNOSIS — Z96651 Presence of right artificial knee joint: Secondary | ICD-10-CM | POA: Diagnosis not present

## 2015-01-31 DIAGNOSIS — R262 Difficulty in walking, not elsewhere classified: Secondary | ICD-10-CM

## 2015-01-31 DIAGNOSIS — R531 Weakness: Secondary | ICD-10-CM

## 2015-01-31 DIAGNOSIS — M25661 Stiffness of right knee, not elsewhere classified: Secondary | ICD-10-CM

## 2015-01-31 DIAGNOSIS — R293 Abnormal posture: Secondary | ICD-10-CM

## 2015-01-31 DIAGNOSIS — M25561 Pain in right knee: Secondary | ICD-10-CM

## 2015-01-31 DIAGNOSIS — R609 Edema, unspecified: Secondary | ICD-10-CM

## 2015-01-31 NOTE — Therapy (Signed)
[place], [place], [address] Phone: [phone]   Fax:  [phone]  Physical Therapy Treatment  Patient Details  Name: Erica Davis MRN: [id number] Date of Birth: Oct 05, 1955 Referring Provider:  Carole Civil, MD  Encounter Date: 01/31/2015      PT End of Session - 01/31/15 1149    Visit Number 8   Number of Visits 18   Date for PT Re-Evaluation 02/11/15   Authorization Type [employer]    Authorization Time Period 01/14/15 to 03/16/15   Authorization - Visit Number 8   Authorization - Number of Visits 10   PT Start Time 7169   PT Stop Time 1155   PT Time Calculation (min) 50 min   Activity Tolerance Patient tolerated treatment well   Behavior During Therapy [facility] for tasks assessed/performed      Past Medical History  Diagnosis Date  . Elevated BP 03/19/2013    Past Surgical History  Procedure Laterality Date  . Cesarean section  1978  . Total knee arthroplasty Right 01/01/2015    Procedure: RIGHT TOTAL KNEE REPLACEMENT;  Surgeon: Carole Civil, MD;  Location: [facility];  Service: Orthopedics;  Laterality: Right;    There were no vitals filed for this visit.  Visit Diagnosis:  General weakness  Difficulty walking  Poor posture  Stiffness of right knee  Right knee pain  Edema  Status post total right knee replacement      Subjective Assessment - 01/31/15 1112    Subjective patient reports stiffness and soreness in Rt knee today.  Reports doing alot of housework and thinks she may have overdone it.  States she's having calf soreness also.    Currently in Pain? Yes   Pain Score 2    Pain Location Knee   Pain Orientation Right                         [facility] Adult PT Treatment/Exercise - 01/31/15 0001    Knee/Hip Exercises: Stretches   Active Hamstring Stretch 3 reps;30 seconds   Active Hamstring Stretch Limitations stairs    Gastroc Stretch 3 reps;30 seconds   Gastroc Stretch Limitations slant board   Knee/Hip Exercises: Aerobic   Stationary Bike 8 minutes full revolutions seat 7   Knee/Hip Exercises: Standing   Heel Raises Both;1 set;20 reps   Heel Raises Limitations No HHA   Forward Lunges Both;1 set;10 reps   Forward Lunges Limitations 4 inch box no HHA   Lateral Step Up Both;10 reps;Hand Hold: 1   Lateral Step Up Limitations 7" step 1 HHA   Forward Step Up Both;1 set;15 reps   Forward Step Up Limitations 7" step with 1 HHA   Functional Squat 15 reps   SLS R 13 seconds at best, L 15 seconds at best, solid surface    Other Standing Knee Exercises balance beam 2Rt tandem and retro, side stepping with BTB 2RT                PT Education - 01/31/15 1148    Education provided Yes   Education Details gastroc stretch for HEP, limiting activites at home with rest breaks, changing postitions of knee to decrease stiffness   Person(s) Educated Patient   Methods Explanation;Demonstration   Comprehension Verbalized understanding;Returned demonstration          PT Short Term Goals - 01/27/15 1122    PT SHORT TERM GOAL #1  Title Patient will improve R knee ROM to 0 degrees extension and at least 120 degrees flexion   Baseline 01/27/2015 AROM 0-120 degrees   Status Achieved   PT SHORT TERM GOAL #2   Title Patient will be able to ambulate at least 30 minutes without assistive device and pain no more than 1/10 R knee    Status On-going   PT SHORT TERM GOAL #3   Title Patient will be able to verbalize and demonstrate appropriate techniques for edema managment independently    Status On-going   PT SHORT TERM GOAL #4   Title Patient will be independent in correctly and consistently performing an appropriate advanced HEP, to be updated PRN    Status Achieved           PT Long Term Goals - 01/27/15 1125    PT LONG TERM GOAL #1   Title Patient will demonstrate R knee extension of 0 degrees and R knee flexion of 130 degrees, 0/10    Status On-going   PT LONG TERM GOAL #2   Title Patient will be able to ambulate unlimited distances over level and uneven surfaces with no assistive device and pain R knee no more than 1/10   PT LONG TERM GOAL #3   Title Patient will be able to ascend and descend full flight of stairs with U railing, step over step pattern, no unsteadiness, and no circumduction R LE, R knee pain no more than 1/10   PT LONG TERM GOAL #4   Title Patient will be able to squat and lift a load of at least 30 pounds with R knee pain no more than 1/10 in order to assist her in functional baby-sitting tasks                Plan - 01/31/15 1149    Clinical Impression Statement Instructed wtih gastroc stretch for HEP as patient reporting generalized sorenss in calves.  Progressed most exericses without UE asssitance and increased to 7" step height with forward and lateral step ups using 1 HHA.   PT wtih audible grinding in her Lt knee wtih attempt at lateral lunges and squats.   Pt with difficulty balancing on either LE today greater than 12 seconds.  Pt instructed to also work on SLS time as well.     PT Next Visit Plan Progress functional strength and balance activiities.  Add hurdles and vector stance next session.    Consulted and Agree with Plan of Care Patient        Problem List Patient Active Problem List   Diagnosis Date Noted  . Status post total right knee replacement 01/13/2015  . Aftercare following surgery of the musculoskeletal system 01/13/2015  . Primary osteoarthritis of right knee 01/01/2015  . Elevated BP 03/19/2013    Erica Davis, PTA/CLT [phone]  01/31/2015, 11:52 AM  [facility] [address] [place], [place], [address] Phone: [phone]   Fax:  [phone]

## 2015-02-03 ENCOUNTER — Ambulatory Visit (HOSPITAL_COMMUNITY): Payer: PRIVATE HEALTH INSURANCE | Admitting: Physical Therapy

## 2015-02-03 DIAGNOSIS — M25561 Pain in right knee: Secondary | ICD-10-CM

## 2015-02-03 DIAGNOSIS — Z96651 Presence of right artificial knee joint: Secondary | ICD-10-CM | POA: Diagnosis not present

## 2015-02-03 DIAGNOSIS — M25661 Stiffness of right knee, not elsewhere classified: Secondary | ICD-10-CM

## 2015-02-03 DIAGNOSIS — R531 Weakness: Secondary | ICD-10-CM

## 2015-02-03 DIAGNOSIS — R262 Difficulty in walking, not elsewhere classified: Secondary | ICD-10-CM

## 2015-02-03 NOTE — Therapy (Signed)
[place], [place], [address] Phone: [phone]   Fax:  [phone]  Physical Therapy Treatment  Patient Details  Name: Erica Davis MRN: [id number] Date of Birth: December 29, 1955 Referring Provider:  Delphina Cahill, MD  Encounter Date: 02/03/2015      PT End of Session - 02/03/15 1146    Visit Number 9   Number of Visits 18   Date for PT Re-Evaluation 02/11/15   Authorization Type [employer]    Authorization Time Period 01/14/15 to 03/16/15   Authorization - Visit Number 9   Authorization - Number of Visits 10   PT Start Time 1101   PT Stop Time 1150   PT Time Calculation (min) 49 min   Equipment Utilized During Treatment Gait belt   Activity Tolerance Patient tolerated treatment well   Behavior During Therapy [facility] for tasks assessed/performed      Past Medical History  Diagnosis Date  . Elevated BP 03/19/2013    Past Surgical History  Procedure Laterality Date  . Cesarean section  1978  . Total knee arthroplasty Right 01/01/2015    Procedure: RIGHT TOTAL KNEE REPLACEMENT;  Surgeon: Carole Civil, MD;  Location: [facility];  Service: Orthopedics;  Laterality: Right;    There were no vitals filed for this visit.  Visit Diagnosis:  General weakness  Stiffness of right knee  Right knee pain  Difficulty walking      Subjective Assessment - 02/03/15 1102    Subjective Pt reports that she feels really good today, she denies having any pain in her knee.   Currently in Pain? No/denies   Pain Score 0-No pain             [facility] Adult PT Treatment/Exercise - 02/03/15 0001    Knee/Hip Exercises: Stretches   Active Hamstring Stretch 3 reps;30 seconds   Active Hamstring Stretch Limitations 14in step   Gastroc Stretch 3 reps;30 seconds   Gastroc Stretch Limitations slant board   Knee/Hip Exercises: Aerobic   Stationary Bike 8 minutes full revolutions seat 7   Knee/Hip Exercises: Standing   Heel Raises  1 set;20 reps   Heel Raises Limitations No HHA   Lateral Step Up Both;10 reps;Hand Hold: 1   Lateral Step Up Limitations 4" + airex   Forward Step Up Both;1 set;15 reps   Forward Step Up Limitations 4" + airex   SLS with Vectors 5" holds x 3   Other Standing Knee Exercises tap ups at 6" step, x15 solid surface, x 15 on foam   Other Standing Knee Exercises balance beam 2Rt tandem and retro, forward over hurdles, side stepping with BTB 2RT                  PT Short Term Goals - 01/27/15 1122    PT SHORT TERM GOAL #1   Title Patient will improve R knee ROM to 0 degrees extension and at least 120 degrees flexion   Baseline 01/27/2015 AROM 0-120 degrees   Status Achieved   PT SHORT TERM GOAL #2   Title Patient will be able to ambulate at least 30 minutes without assistive device and pain no more than 1/10 R knee    Status On-going   PT SHORT TERM GOAL #3   Title Patient will be able to verbalize and demonstrate appropriate techniques for edema managment independently    Status On-going   PT SHORT TERM GOAL #4   Title Patient will be  independent in correctly and consistently performing an appropriate advanced HEP, to be updated PRN    Status Achieved           PT Long Term Goals - 01/27/15 1125    PT LONG TERM GOAL #1   Title Patient will demonstrate R knee extension of 0 degrees and R knee flexion of 130 degrees, 0/10   Status On-going   PT LONG TERM GOAL #2   Title Patient will be able to ambulate unlimited distances over level and uneven surfaces with no assistive device and pain R knee no more than 1/10   PT LONG TERM GOAL #3   Title Patient will be able to ascend and descend full flight of stairs with U railing, step over step pattern, no unsteadiness, and no circumduction R LE, R knee pain no more than 1/10   PT LONG TERM GOAL #4   Title Patient will be able to squat and lift a load of at least 30 pounds with R knee pain no more than 1/10 in order to assist her in  functional baby-sitting tasks                Plan - 02/03/15 1147    Clinical Impression Statement Treatment session focused on balance training and functional strengthening. Pt had difficulty completing SLS with vectors today, requiring intermittent UE support during the exercise. Tap ups at 6" step on solid surface and foam were added to improve pt's ability to maintain SLS, she was able to complete without UE support on both surfaces. Hurdles were added to balance beam to improve SLS, pt had increased difficulty with completing 12" hurdles with proper form.     PT Next Visit Plan Continue with vector stance to improve balance        Problem List Patient Active Problem List   Diagnosis Date Noted  . Status post total right knee replacement 01/13/2015  . Aftercare following surgery of the musculoskeletal system 01/13/2015  . Primary osteoarthritis of right knee 01/01/2015  . Elevated BP 03/19/2013    Hilma Favors, PT, DPT [phone] 02/03/2015, 11:56 AM  [facility] [address] [place], [place], [address] Phone: [phone]   Fax:  [phone]

## 2015-02-05 ENCOUNTER — Encounter (HOSPITAL_COMMUNITY): Payer: PRIVATE HEALTH INSURANCE | Admitting: Physical Therapy

## 2015-02-07 ENCOUNTER — Ambulatory Visit (HOSPITAL_COMMUNITY): Payer: PRIVATE HEALTH INSURANCE

## 2015-02-07 DIAGNOSIS — R609 Edema, unspecified: Secondary | ICD-10-CM

## 2015-02-07 DIAGNOSIS — R262 Difficulty in walking, not elsewhere classified: Secondary | ICD-10-CM

## 2015-02-07 DIAGNOSIS — R531 Weakness: Secondary | ICD-10-CM

## 2015-02-07 DIAGNOSIS — M25661 Stiffness of right knee, not elsewhere classified: Secondary | ICD-10-CM

## 2015-02-07 DIAGNOSIS — M25561 Pain in right knee: Secondary | ICD-10-CM

## 2015-02-07 DIAGNOSIS — R293 Abnormal posture: Secondary | ICD-10-CM

## 2015-02-07 DIAGNOSIS — Z96651 Presence of right artificial knee joint: Secondary | ICD-10-CM | POA: Diagnosis not present

## 2015-02-07 NOTE — Therapy (Signed)
[place], [place], [address] Phone: [phone]   Fax:  [phone]  Physical Therapy Treatment  Patient Details  Name: Erica Davis MRN: [id number] Date of Birth: [birth year] Referring Provider:  Celene Squibb, MD  Encounter Date: 02/07/2015      PT End of Session - 02/07/15 1204    Visit Number 10   Number of Visits 18   Date for PT Re-Evaluation 02/11/15   Authorization Type [employer]    Authorization Time Period 01/14/15 to 03/16/15   Authorization - Visit Number 10   Authorization - Number of Visits 10   PT Start Time 3154   PT Stop Time 1141   PT Time Calculation (min) 38 min   Activity Tolerance Patient tolerated treatment well   Behavior During Therapy [facility] for tasks assessed/performed      Past Medical History  Diagnosis Date  . Elevated BP 03/19/2013    Past Surgical History  Procedure Laterality Date  . Cesarean section  1978  . Total knee arthroplasty Right 01/01/2015    Procedure: RIGHT TOTAL KNEE REPLACEMENT;  Surgeon: Carole Civil, MD;  Location: [facility];  Service: Orthopedics;  Laterality: Right;    There were no vitals filed for this visit.  Visit Diagnosis:  Stiffness of right knee  Right knee pain  General weakness  Difficulty walking  Poor posture  Edema      Subjective Assessment - 02/07/15 1108    Subjective Patient doing well today,. reports that she walked for 15 inutes at home yesterday, and that it felt good.    Pertinent History Patient had a total knee replacement on August 17th at [facility] and was discharged yesterday. 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 Reports she has been keeping up with some exercises for her leg.    Patient Stated Goals improve flexibility, be able to use leg properly, reduce pain    Currently in Pain? No/denies                         [facility] Adult PT Treatment/Exercise - 02/07/15 0001    Knee/Hip  Exercises: Aerobic   Nustep 5 min WU  no charge   Knee/Hip Exercises: Standing   Heel Raises 20 reps;2 sets   Heel Raises Limitations No HHA   Forward Lunges Both;10 reps;2 sets   Forward Lunges Limitations 6 inche box   Side Lunges Right;2 sets;10 reps   Side Lunges Limitations 4 inch box   Lateral Step Up Both;10 reps;Hand Hold: 1;2 sets   Lateral Step Up Limitations 4" + airex   Forward Step Up Right;2 sets;10 reps;Step Height: 4";Step Height: 2";Hand Hold: 0  step + airex; LLE toe touch for stability   Forward Step Up Limitations RLE SLS attempt c ea rep   Walking with Sports Cord Standing TKE 2x10 n R   Thick cord: retroamb concentric, fwd eccentric 10 x 59ft   Knee/Hip Exercises: Seated   Sit to Sand 2 sets;15 reps   Knee/Hip Exercises: Supine   Short Arc Quad Sets Strengthening;Right;2 sets;15 reps   Short Arc Quad Sets Limitations 4lb cuff weight on shoe                PT Education - 02/07/15 1203    Education provided Yes   Education Details Continue to progress ambulation time at home; decrease gait speed as needed for comfort.  Person(s) Educated Patient   Methods Explanation   Comprehension Verbalized understanding          PT Short Term Goals - 01/27/15 1122    PT SHORT TERM GOAL #1   Title Patient will improve R knee ROM to 0 degrees extension and at least 120 degrees flexion   Baseline 01/27/2015 AROM 0-120 degrees   Status Achieved   PT SHORT TERM GOAL #2   Title Patient will be able to ambulate at least 30 minutes without assistive device and pain no more than 1/10 R knee    Status On-going   PT SHORT TERM GOAL #3   Title Patient will be able to verbalize and demonstrate appropriate techniques for edema managment independently    Status On-going   PT SHORT TERM GOAL #4   Title Patient will be independent in correctly and consistently performing an appropriate advanced HEP, to be updated PRN    Status Achieved           PT Long Term  Goals - 01/27/15 1125    PT LONG TERM GOAL #1   Title Patient will demonstrate R knee extension of 0 degrees and R knee flexion of 130 degrees, 0/10   Status On-going   PT LONG TERM GOAL #2   Title Patient will be able to ambulate unlimited distances over level and uneven surfaces with no assistive device and pain R knee no more than 1/10   PT LONG TERM GOAL #3   Title Patient will be able to ascend and descend full flight of stairs with U railing, step over step pattern, no unsteadiness, and no circumduction R LE, R knee pain no more than 1/10   PT LONG TERM GOAL #4   Title Patient will be able to squat and lift a load of at least 30 pounds with R knee pain no more than 1/10 in order to assist her in functional baby-sitting tasks                Plan - 02/07/15 1205    Clinical Impression Statement Pt progressing well with goals, evidnect in improved ROM, strength, activity tolerance, and strength. Pt continues to show difficulty in isolated knee extension, evident in heavy gluteal comensation with function actiivity. Will continue to focus on isolated quads strengthening going forward.    Pt will benefit from skilled therapeutic intervention in order to improve on the following deficits Abnormal gait;Decreased endurance;Hypomobility;Decreased scar mobility;Increased edema;Decreased activity tolerance;Decreased strength;Pain;Decreased balance;Decreased mobility;Difficulty walking;Improper body mechanics;Decreased coordination;Postural dysfunction;Impaired flexibility   Rehab Potential Excellent   PT Frequency 3x / week   PT Duration 6 weeks   PT Treatment/Interventions ADLs/Self Care Home Management;Cryotherapy;DME Instruction;Gait training;Stair training;Functional mobility training;Therapeutic activities;Therapeutic exercise;Balance training;Neuromuscular re-education;Patient/family education;Manual techniques;Scar mobilization   PT Next Visit Plan Cont to progress functional exercises  while eliminating compensation of glutes; progress isolated quads strengthening (SAQ, LAQ, TKE)    PT Home Exercise Plan No updates at this time.    Consulted and Agree with Plan of Care Patient        Problem List Patient Active Problem List   Diagnosis Date Noted  . Status post total right knee replacement 01/13/2015  . Aftercare following surgery of the musculoskeletal system 01/13/2015  . Primary osteoarthritis of right knee 01/01/2015  . Elevated BP 03/19/2013    Makiah Foye C 02/07/2015, 12:09 PM 12:09 PM  Etta Grandchild, PT, DPT [place] License # [id number]       [facility]  [place], [place], [address] Phone: [phone]   Fax:  [phone]

## 2015-02-10 ENCOUNTER — Ambulatory Visit (HOSPITAL_COMMUNITY): Payer: PRIVATE HEALTH INSURANCE | Admitting: Physical Therapy

## 2015-02-10 DIAGNOSIS — R262 Difficulty in walking, not elsewhere classified: Secondary | ICD-10-CM

## 2015-02-10 DIAGNOSIS — M25561 Pain in right knee: Secondary | ICD-10-CM

## 2015-02-10 DIAGNOSIS — Z96651 Presence of right artificial knee joint: Secondary | ICD-10-CM | POA: Diagnosis not present

## 2015-02-10 DIAGNOSIS — M25661 Stiffness of right knee, not elsewhere classified: Secondary | ICD-10-CM

## 2015-02-10 DIAGNOSIS — R531 Weakness: Secondary | ICD-10-CM

## 2015-02-10 NOTE — Therapy (Signed)
[place], [place], [address] Phone: [phone]   Fax:  [phone]  Physical Therapy Treatment  Patient Details  Name: ANALINA FILLA MRN: [id number] Date of Birth: [birth year] Referring Provider:  Carole Civil, MD  Encounter Date: 02/10/2015      PT End of Session - 02/10/15 1157    Visit Number 11   Number of Visits 18   Date for PT Re-Evaluation 02/12/15   Authorization Type [employer]    Authorization Time Period 01/14/15 to 03/16/15   Authorization - Visit Number 11   Authorization - Number of Visits 18   PT Start Time 1100   PT Stop Time 1142   PT Time Calculation (min) 42 min   Activity Tolerance Patient tolerated treatment well   Behavior During Therapy [facility] for tasks assessed/performed      Past Medical History  Diagnosis Date  . Elevated BP 03/19/2013    Past Surgical History  Procedure Laterality Date  . Cesarean section  1978  . Total knee arthroplasty Right 01/01/2015    Procedure: RIGHT TOTAL KNEE REPLACEMENT;  Surgeon: Carole Civil, MD;  Location: [facility];  Service: Orthopedics;  Laterality: Right;    There were no vitals filed for this visit.  Visit Diagnosis:  Stiffness of right knee  Right knee pain  General weakness  Difficulty walking      Subjective Assessment - 02/10/15 1105    Subjective Pt reports that she feels that her knee is healing, but she still gets stabbing pain at times.    Currently in Pain? Yes   Pain Score 1                          [facility] Adult PT Treatment/Exercise - 02/10/15 0001    Knee/Hip Exercises: Stretches   Active Hamstring Stretch 3 reps;30 seconds   Active Hamstring Stretch Limitations 12" step   Gastroc Stretch 3 reps;30 seconds   Gastroc Stretch Limitations slant board   Knee/Hip Exercises: Aerobic   Stationary Bike 10' seat 7   Knee/Hip Exercises: Standing   Heel Raises 15 reps   Heel Raises Limitations off  of 4" step   Lateral Step Up Right;10 reps   Lateral Step Up Limitations 4" + airex   Forward Step Up Right;10 reps   Forward Step Up Limitations 4" + airex   Step Down 15 reps;Step Height: 4"   Functional Squat 15 reps   Wall Squat 10 reps   SLS with Vectors 5" holds x 5   Walking with Sports Cord Standing TKE 2x10 n R   Thick cord: backward, fwd 10x62ft, sidestepping x 10                  PT Short Term Goals - 01/27/15 1122    PT SHORT TERM GOAL #1   Title Patient will improve R knee ROM to 0 degrees extension and at least 120 degrees flexion   Baseline 01/27/2015 AROM 0-120 degrees   Status Achieved   PT SHORT TERM GOAL #2   Title Patient will be able to ambulate at least 30 minutes without assistive device and pain no more than 1/10 R knee    Status On-going   PT SHORT TERM GOAL #3   Title Patient will be able to verbalize and demonstrate appropriate techniques for edema managment independently    Status On-going   PT SHORT TERM GOAL #4  Title Patient will be independent in correctly and consistently performing an appropriate advanced HEP, to be updated PRN    Status Achieved           PT Long Term Goals - 01/27/15 1125    PT LONG TERM GOAL #1   Title Patient will demonstrate R knee extension of 0 degrees and R knee flexion of 130 degrees, 0/10   Status On-going   PT LONG TERM GOAL #2   Title Patient will be able to ambulate unlimited distances over level and uneven surfaces with no assistive device and pain R knee no more than 1/10   PT LONG TERM GOAL #3   Title Patient will be able to ascend and descend full flight of stairs with U railing, step over step pattern, no unsteadiness, and no circumduction R LE, R knee pain no more than 1/10   PT LONG TERM GOAL #4   Title Patient will be able to squat and lift a load of at least 30 pounds with R knee pain no more than 1/10 in order to assist her in functional baby-sitting tasks                Plan -  02/10/15 1159    Clinical Impression Statement Continued with sport cord exercises today, with emphasis on quad control and contraction. Added sidestepping with sport cord to improve hip abductor strength, and pt was able to complete without c/o pain. Step downs were reintroduced to improve quad strength and eccentric control, and pt was able to complete the exercise with proper form with minimal cueing.    PT Next Visit Plan Continue with functional strengthening with focus on quad strength        Problem List Patient Active Problem List   Diagnosis Date Noted  . Status post total right knee replacement 01/13/2015  . Aftercare following surgery of the musculoskeletal system 01/13/2015  . Primary osteoarthritis of right knee 01/01/2015  . Elevated BP 03/19/2013    Hilma Favors, PT, DPT [phone] 02/10/2015, 12:09 PM  [place] [address] [place], [place], [address] Phone: [phone]   Fax:  [phone]

## 2015-02-11 ENCOUNTER — Encounter: Payer: Self-pay | Admitting: Orthopedic Surgery

## 2015-02-11 ENCOUNTER — Ambulatory Visit (INDEPENDENT_AMBULATORY_CARE_PROVIDER_SITE_OTHER): Payer: PRIVATE HEALTH INSURANCE | Admitting: Orthopedic Surgery

## 2015-02-11 ENCOUNTER — Telehealth (HOSPITAL_COMMUNITY): Payer: Self-pay | Admitting: Physical Therapy

## 2015-02-11 VITALS — BP 141/85 | Ht 64.5 in | Wt 198.0 lb

## 2015-02-11 DIAGNOSIS — Z96651 Presence of right artificial knee joint: Secondary | ICD-10-CM

## 2015-02-11 DIAGNOSIS — Z4789 Encounter for other orthopedic aftercare: Secondary | ICD-10-CM

## 2015-02-11 NOTE — Telephone Encounter (Signed)
Requested to be Discharged, pt is doing so well

## 2015-02-11 NOTE — Progress Notes (Signed)
Patient ID: Erica Davis, female   DOB: July 13, 1955, 59 y.o.   MRN: [id number]  Follow up visit  Chief Complaint  Patient presents with  . Follow-up    post op 2, RT TKA, DOS 01/01/15    BP 141/85 mmHg  Ht 5' 4.5" (1.638 m)  Wt 198 lb (89.812 kg)  BMI 33.47 kg/m2  No diagnosis found.  Patient comes in for 6 weeks postop right total knee replacement doing well taking Tylenol for pain her knee flexion is approximately 115 on the table she has full extension she has good quadriceps control  Continue therapy follow-up in 6 weeks.

## 2015-02-12 ENCOUNTER — Encounter (HOSPITAL_COMMUNITY): Payer: PRIVATE HEALTH INSURANCE | Admitting: Physical Therapy

## 2015-02-14 ENCOUNTER — Encounter (HOSPITAL_COMMUNITY): Payer: PRIVATE HEALTH INSURANCE | Admitting: Physical Therapy

## 2015-03-25 ENCOUNTER — Encounter: Payer: Self-pay | Admitting: Orthopedic Surgery

## 2015-03-25 ENCOUNTER — Ambulatory Visit (INDEPENDENT_AMBULATORY_CARE_PROVIDER_SITE_OTHER): Payer: PRIVATE HEALTH INSURANCE | Admitting: Orthopedic Surgery

## 2015-03-25 VITALS — BP 146/85 | Ht 64.5 in | Wt 198.0 lb

## 2015-03-25 DIAGNOSIS — Z96651 Presence of right artificial knee joint: Secondary | ICD-10-CM

## 2015-03-25 DIAGNOSIS — Z4789 Encounter for other orthopedic aftercare: Secondary | ICD-10-CM

## 2015-03-25 NOTE — Progress Notes (Signed)
Patient ID: Erica Davis, female   DOB: 1955/09/08, 59 y.o.   MRN: [id number]  Follow up visit  Chief Complaint  Patient presents with  . Follow-up    6 week recheck on right knee replacement, DOS 01/01/15.    BP 146/85 mmHg  Ht 5' 4.5" (1.638 m)  Wt 198 lb (89.812 kg)  BMI 33.47 kg/m2  Encounter Diagnoses  Name Primary?  [name] Aftercare following surgery of the musculoskeletal system Yes  . Status post total right knee replacement     She is here for her nearly 3 month follow-up status post right total knee she's doing very well she has minimal pain requiring occasional Tylenol for discomfort in the evening. She's walking well she is functioning well she has no complaints and all see her again in 3 months.

## 2015-04-17 ENCOUNTER — Encounter: Payer: Self-pay | Admitting: Adult Health

## 2015-04-17 ENCOUNTER — Other Ambulatory Visit (HOSPITAL_COMMUNITY)
Admission: RE | Admit: 2015-04-17 | Discharge: 2015-04-17 | Disposition: A | Discharge status: Home / Self Care | Payer: PRIVATE HEALTH INSURANCE | Source: Ambulatory Visit | Attending: Adult Health | Admitting: Adult Health

## 2015-04-17 ENCOUNTER — Ambulatory Visit (INDEPENDENT_AMBULATORY_CARE_PROVIDER_SITE_OTHER): Payer: PRIVATE HEALTH INSURANCE | Admitting: Adult Health

## 2015-04-17 VITALS — BP 130/72 | HR 76 | Ht 63.25 in | Wt 193.5 lb

## 2015-04-17 DIAGNOSIS — Z1211 Encounter for screening for malignant neoplasm of colon: Secondary | ICD-10-CM

## 2015-04-17 DIAGNOSIS — Z01419 Encounter for gynecological examination (general) (routine) without abnormal findings: Secondary | ICD-10-CM

## 2015-04-17 DIAGNOSIS — Z1151 Encounter for screening for human papillomavirus (HPV): Secondary | ICD-10-CM | POA: Diagnosis present

## 2015-04-17 LAB — HEMOCCULT GUIAC POC 1CARD (OFFICE): Fecal Occult Blood, POC: NEGATIVE

## 2015-04-17 NOTE — Progress Notes (Signed)
Patient ID: Erica Davis, female   DOB: April 03, 1956, 59 y.o.   MRN: [id number] History of Present Illness: Erica Davis is a 59 year old white female,married in for a well woman gyn exam and pap.She had knee replacement this year.She declines the flu shot. PCP is Zack Hall,MD.   Current Medications, Allergies, Past Medical History, Past Surgical History, Family History and Social History were reviewed in [employer] record.     Review of Systems: Patient denies any headaches, hearing loss, fatigue, blurred vision, shortness of breath, chest pain, abdominal pain, problems with bowel movements, urination, or intercourse. No joint pain or mood swings.    Physical Exam:BP 130/72 mmHg  Pulse 76  Ht 5' 3.25" (1.607 m)  Wt 193 lb 8 oz (87.771 kg)  BMI 33.99 kg/m2  General:  Well developed, well nourished, no acute distress Skin:  Warm and dry Neck:  Midline trachea, normal thyroid, good ROM, no lymphadenopathy Lungs; Clear to auscultation bilaterally Breast:  No dominant palpable mass, retraction, or nipple discharge Cardiovascular: Regular rate and rhythm Abdomen:  Soft, non tender, no hepatosplenomegaly Pelvic:  External genitalia is normal in appearance, no lesions.  The vagina has decreased color, moisture and rugae. Urethra has no lesions or masses. The cervix is smooth and stenotic at os.  Uterus is felt to be normal size, shape, and contour.  No adnexal masses or tenderness noted.Bladder is non tender, no masses felt. Rectal: Good sphincter tone, no polyps, or hemorrhoids felt.  Hemoccult negative. Extremities/musculoskeletal:  No swelling or varicosities noted, no clubbing or cyanosis Psych:  No mood changes, alert and cooperative,seems happy   Impression: Well woman gyn exam and pap    Plan: Physical in 1 year,pap in 3 if normal with negative HPV Mammogram yearly Labs with PCP Colonoscopy advised

## 2015-04-17 NOTE — Patient Instructions (Signed)
Physical in 1 year Mammogram yearly Labs with PCP Colonoscopy advised  

## 2015-04-21 LAB — CYTOLOGY - PAP

## 2015-05-21 ENCOUNTER — Encounter: Payer: Self-pay | Admitting: *Deleted

## 2015-06-02 ENCOUNTER — Telehealth: Payer: Self-pay | Admitting: Orthopedic Surgery

## 2015-06-02 ENCOUNTER — Other Ambulatory Visit: Payer: Self-pay | Admitting: *Deleted

## 2015-06-02 MED ORDER — CEPHALEXIN 500 MG PO CAPS: ORAL_CAPSULE | ORAL | Status: DC

## 2015-06-02 NOTE — Telephone Encounter (Signed)
Patient called about having pre-medication prescription for her dental appointment in February - her pharmacy is [facility] in [place].  Patient ph# is [phone]

## 2015-06-02 NOTE — Telephone Encounter (Signed)
Med sent to pharmacy as requested, patient aware

## 2015-06-26 ENCOUNTER — Ambulatory Visit: Payer: PRIVATE HEALTH INSURANCE | Admitting: Orthopedic Surgery

## 2015-07-01 ENCOUNTER — Encounter: Payer: Self-pay | Admitting: Orthopedic Surgery

## 2015-07-01 ENCOUNTER — Ambulatory Visit (INDEPENDENT_AMBULATORY_CARE_PROVIDER_SITE_OTHER): Payer: PRIVATE HEALTH INSURANCE | Admitting: Orthopedic Surgery

## 2015-07-01 VITALS — BP 135/81 | Ht 63.25 in | Wt 193.0 lb

## 2015-07-01 DIAGNOSIS — Z96651 Presence of right artificial knee joint: Secondary | ICD-10-CM | POA: Diagnosis not present

## 2015-07-01 NOTE — Progress Notes (Signed)
Patient ID: Erica Davis, female   DOB: [birth year], 59 y.o.   MRN: [id number]  Chief Complaint  Patient presents with  . Follow-up    3 month follow up RT TKA, DOS 01/01/15    HPI Erica Davis Erica Davis is a 60 y.o. female.  All of his after total knee replacement months. No complaints doing well functioning well.  Exam vital signs are BP 135/81 mmHg  Ht 5' 3.25" (1.607 m)  Wt 193 lb (87.544 kg)  BMI 33.90 kg/m2  Review of Systems Review of Systems 1. Review of systems no catching locking giving way of the knee no fever    No Known Allergies  Current Outpatient Prescriptions  Medication Sig Dispense Refill  . acetaminophen (TYLENOL) 500 MG tablet Take 500 mg by mouth every 6 (six) hours as needed.    . Calcium Citrate-Vitamin D (CALCIUM + D PO) Take by mouth daily.     . cephALEXin (KEFLEX) 500 MG capsule Take four caps by mouth prior to dental work 4 capsule 3  . cholecalciferol (VITAMIN D) 1000 UNITS tablet Take 1,000 Units by mouth daily.    . Coenzyme Q10 (CO Q 10) 100 MG CAPS Take by mouth daily.    . Glucosamine-Chondroitin (OSTEO BI-FLEX REGULAR STRENGTH PO) Take by mouth 2 (two) times daily.     [name] loratadine (CLARITIN) 10 MG tablet Take 10 mg by mouth as needed.     . Multiple Vitamin (MULTIVITAMIN) tablet Take 1 tablet by mouth daily.    . Probiotic Product (PROBIOTIC PO) Take by mouth daily.    . vitamin C (ASCORBIC ACID) 500 MG tablet Take 500 mg by mouth daily.     No current facility-administered medications for this visit.      Physical Exam Physical Exam Blood pressure 135/81, height 5' 3.25" (1.607 m), weight 193 lb (87.544 kg).  She is ambulating very well there areassistive devices she is awake alert and oriented 3 mood and affect are normal incision clean dry and intact she has excellent quadriceps strength with a good straight leg raise no extensor lag she stable in extension knee flexion 0-120 no effusion. No peripheral edema    Data Reviewed No no  data to review  Assessment    Status post total knee stable doing well    Plan    Follow-up in 6 months for the first one year annual x-ray      Erica Davis 07/01/2015, 9:56 AM

## 2015-07-23 NOTE — Therapy (Signed)
[place], [place], [address] Phone: [phone]   Fax:  [phone]  Patient Details  Name: Erica Davis MRN: [id number] Date of Birth: [birth year] Referring Provider:  Carole Civil, MD  Encounter Date: 07/23/2015   PHYSICAL THERAPY DISCHARGE SUMMARY  Visits from Start of Care: 11  Current functional level related to goals / functional outcomes: Patient has not returned since last skilled PT session    Remaining deficits: Unable to assess    Education / Equipment: N/A  Plan: Patient agrees to discharge.  Patient goals were partially met. Patient is being discharged due to not returning since the last visit.  ?????       Deniece Ree PT, DPT [facility] [address] [place], [place], [address] Phone: [phone]   Fax:  [phone]

## 2015-10-29 ENCOUNTER — Encounter: Payer: Self-pay | Admitting: Adult Health

## 2015-12-23 ENCOUNTER — Encounter: Payer: Self-pay | Admitting: Orthopedic Surgery

## 2015-12-30 ENCOUNTER — Ambulatory Visit (INDEPENDENT_AMBULATORY_CARE_PROVIDER_SITE_OTHER): Payer: PRIVATE HEALTH INSURANCE | Admitting: Orthopedic Surgery

## 2015-12-30 ENCOUNTER — Ambulatory Visit (INDEPENDENT_AMBULATORY_CARE_PROVIDER_SITE_OTHER): Payer: PRIVATE HEALTH INSURANCE

## 2015-12-30 ENCOUNTER — Encounter: Payer: Self-pay | Admitting: Orthopedic Surgery

## 2015-12-30 VITALS — BP 142/81 | HR 67 | Ht 63.25 in | Wt 185.0 lb

## 2015-12-30 DIAGNOSIS — Z96651 Presence of right artificial knee joint: Secondary | ICD-10-CM | POA: Diagnosis not present

## 2015-12-30 NOTE — Progress Notes (Signed)
Patient ID: Erica Davis, female   DOB: [birth year], 60 y.o.   MRN: [id number]  Post op annual TKA   Chief Complaint  Patient presents with  . Follow-up    Right knee TKA DOS 01/01/15    HPI Erica Davis is a 60 y.o. female.  Presents for evaluation of her right total knee 1 year postsurgery with no complaints   Past Medical History:  Diagnosis Date  . Elevated BP 03/19/2013    Past Surgical History:  Procedure Laterality Date  . [place]  . TOTAL KNEE ARTHROPLASTY Right 01/01/2015   Procedure: RIGHT TOTAL KNEE REPLACEMENT;  Surgeon: Carole Civil, MD;  Location: [facility];  Service: Orthopedics;  Laterality: Right;     No Known Allergies  Current Outpatient Prescriptions  Medication Sig Dispense Refill  . acetaminophen (TYLENOL) 500 MG tablet Take 500 mg by mouth every 6 (six) hours as needed.    [name] amoxicillin-clavulanate (AUGMENTIN) 250-125 MG tablet Take 1 tablet by mouth 3 (three) times daily.    . Calcium Citrate-Vitamin D (CALCIUM + D PO) Take by mouth daily.     . cholecalciferol (VITAMIN D) 1000 UNITS tablet Take 1,000 Units by mouth daily.    . Coenzyme Q10 (CO Q 10) 100 MG CAPS Take by mouth daily.    . Glucosamine-Chondroitin (OSTEO BI-FLEX REGULAR STRENGTH PO) Take by mouth 2 (two) times daily.     [name] loratadine (CLARITIN) 10 MG tablet Take 10 mg by mouth as needed.     . Multiple Vitamin (MULTIVITAMIN) tablet Take 1 tablet by mouth daily.    . Probiotic Product (PROBIOTIC PO) Take by mouth daily.    . vitamin C (ASCORBIC ACID) 500 MG tablet Take 500 mg by mouth daily.    . cephALEXin (KEFLEX) 500 MG capsule Take four caps by mouth prior to dental work (Patient not taking: Reported on 12/30/2015) 4 capsule 3   No current facility-administered medications for this visit.     Review of Systems Review of Systems  Constitutional: Negative for fever.  Musculoskeletal: Negative for arthralgias and joint swelling.     Physical Exam Blood pressure  (!) 142/81, pulse 67, height 5' 3.25" (1.607 m), weight 185 lb (83.9 kg).   Gen. appearance is normal there are no congenital abnormalities   The patient is oriented 3   Mood and affect are normal   Ambulation is without assistive device   Knee inspection reveals a well-healed incision with no swelling   Knee flexion 120  Stability in the anteroposterior plane is normal as well as in the medial lateral plane  Motor exam reveals full extension without extensor lag   Data Reviewed KNEE XRAYS : Normal  Assessment S/P TKA right  Plan    Follow-up one year x-ray again       Arther Abbott 12/30/2015, 12:20 PM

## 2016-02-16 ENCOUNTER — Other Ambulatory Visit (HOSPITAL_COMMUNITY): Payer: Self-pay | Admitting: Internal Medicine

## 2016-02-16 DIAGNOSIS — Z13820 Encounter for screening for osteoporosis: Secondary | ICD-10-CM

## 2016-02-23 ENCOUNTER — Ambulatory Visit (HOSPITAL_COMMUNITY)
Admission: RE | Admit: 2016-02-23 | Discharge: 2016-02-23 | Disposition: A | Discharge status: Home / Self Care | Payer: PRIVATE HEALTH INSURANCE | Source: Ambulatory Visit | Attending: Internal Medicine | Admitting: Internal Medicine

## 2016-02-23 DIAGNOSIS — M85851 Other specified disorders of bone density and structure, right thigh: Secondary | ICD-10-CM | POA: Insufficient documentation

## 2016-02-23 DIAGNOSIS — Z13820 Encounter for screening for osteoporosis: Secondary | ICD-10-CM

## 2016-07-30 ENCOUNTER — Other Ambulatory Visit: Payer: Self-pay | Admitting: Orthopedic Surgery

## 2016-10-26 ENCOUNTER — Telehealth: Payer: Self-pay | Admitting: General Practice

## 2016-10-26 NOTE — Telephone Encounter (Signed)
Patient called in to schedule her screening tcs.  She is not having any problems and she's not on any blood thinners.  She can be reached [phone].

## 2016-10-28 NOTE — Telephone Encounter (Signed)
Tried to call with no answer  

## 2016-11-02 NOTE — Addendum Note (Signed)
Addended by: Mahala Menghini on: 11/02/2016 02:32 PM   Modules accepted: Orders

## 2016-11-02 NOTE — Telephone Encounter (Addendum)
Gastroenterology Pre-Procedure Review  Request Date: Requesting Physician: Dr.Hall  FIRST SCREENING TCS  PATIENT REVIEW QUESTIONS: The patient responded to the following health history questions as indicated:    1. Diabetes Melitis: NO 2. Joint replacements in the past 12 months: YES, 12/2015 3. Major health problems in the past 3 months: NO 4. Has an artificial valve or MVP: NO 5. Has a defibrillator: NO 6. Has been advised in past to take antibiotics in advance of a procedure like teeth cleaning: YES 7. Family history of colon cancer: NO 8. Alcohol Use: NO 9. History of sleep apnea: NO 10. History of coronary artery or other vascular stents placed within the last 12 months: NO    MEDICATIONS & ALLERGIES:    Patient reports the following regarding taking any blood thinners:   Plavix? NO Aspirin? NO Coumadin? NO Brilinta? NO Xarelto? NO Eliquis? NO Pradaxa? NO Savaysa? NO Effient? NO  Patient confirms/reports the following medications:  Current Outpatient Prescriptions  Medication Sig Dispense Refill  . Calcium Citrate-Vitamin D (CALCIUM + D PO) Take by mouth daily.     . cholecalciferol (VITAMIN D) 1000 UNITS tablet Take 1,000 Units by mouth daily.    . Coenzyme Q10 (CO Q 10) 100 MG CAPS Take by mouth daily.    . Glucosamine-Chondroitin (OSTEO BI-FLEX REGULAR STRENGTH PO) Take by mouth 2 (two) times daily.     [name] loratadine (CLARITIN) 10 MG tablet Take 10 mg by mouth as needed.     . Multiple Vitamin (MULTIVITAMIN) tablet Take 1 tablet by mouth daily.    . Probiotic Product (PROBIOTIC PO) Take by mouth daily.    . vitamin C (ASCORBIC ACID) 500 MG tablet Take 500 mg by mouth daily.     No current facility-administered medications for this visit.     Patient confirms/reports the following allergies:  No Known Allergies  No orders of the defined types were placed in this encounter.   AUTHORIZATION INFORMATION Primary Insurance: Gloris Ham,  ID [id number]  ,  Group #:  [id number] Pre-Cert / Auth required:  Pre-Cert / Auth #:    SCHEDULE INFORMATION: Procedure has been scheduled as follows:  Date: , Time:   Location:   This Gastroenterology Pre-Precedure Review Form is being routed to the following provider(s):

## 2016-11-02 NOTE — Telephone Encounter (Signed)
Updated information, patient had knee replacement August 2017 per medical records. No need for antibiotics prior colonoscopy.  Okay to schedule colonoscopy.

## 2016-11-02 NOTE — Telephone Encounter (Signed)
Route to [employer]

## 2016-11-03 NOTE — Telephone Encounter (Signed)
Tried to call with no answer  

## 2016-11-04 ENCOUNTER — Other Ambulatory Visit: Payer: Self-pay

## 2016-11-04 DIAGNOSIS — Z1211 Encounter for screening for malignant neoplasm of colon: Secondary | ICD-10-CM

## 2016-11-04 MED ORDER — PEG 3350-KCL-NA BICARB-NACL 420 G PO SOLR: 4000.0000 mL | ORAL | 0 refills | Status: DC

## 2016-11-04 NOTE — Telephone Encounter (Signed)
Pt is set up for TCS on 12/17/16 @ 8:30 with [facility]. Instructions are in the mail. NO PA is needed for TCS

## 2016-12-17 ENCOUNTER — Ambulatory Visit (HOSPITAL_COMMUNITY)
Admission: RE | Admit: 2016-12-17 | Discharge: 2016-12-17 | Disposition: A | Discharge status: Home / Self Care | Payer: PRIVATE HEALTH INSURANCE | Source: Ambulatory Visit | Attending: Gastroenterology | Admitting: Gastroenterology

## 2016-12-17 ENCOUNTER — Encounter (HOSPITAL_COMMUNITY)
Admission: RE | Disposition: A | Discharge status: Home / Self Care | Payer: Self-pay | Source: Ambulatory Visit | Attending: Gastroenterology

## 2016-12-17 ENCOUNTER — Encounter (HOSPITAL_COMMUNITY): Payer: Self-pay | Admitting: *Deleted

## 2016-12-17 DIAGNOSIS — K5730 Diverticulosis of large intestine without perforation or abscess without bleeding: Secondary | ICD-10-CM | POA: Insufficient documentation

## 2016-12-17 DIAGNOSIS — K648 Other hemorrhoids: Secondary | ICD-10-CM | POA: Insufficient documentation

## 2016-12-17 DIAGNOSIS — Z1212 Encounter for screening for malignant neoplasm of rectum: Secondary | ICD-10-CM | POA: Diagnosis not present

## 2016-12-17 DIAGNOSIS — Z1211 Encounter for screening for malignant neoplasm of colon: Secondary | ICD-10-CM | POA: Diagnosis not present

## 2016-12-17 DIAGNOSIS — Z96651 Presence of right artificial knee joint: Secondary | ICD-10-CM | POA: Insufficient documentation

## 2016-12-17 DIAGNOSIS — D490 Neoplasm of unspecified behavior of digestive system: Secondary | ICD-10-CM | POA: Diagnosis not present

## 2016-12-17 DIAGNOSIS — Z7982 Long term (current) use of aspirin: Secondary | ICD-10-CM | POA: Diagnosis not present

## 2016-12-17 HISTORY — PX: COLONOSCOPY: SHX5424

## 2016-12-17 SURGERY — COLONOSCOPY
Anesthesia: Moderate Sedation

## 2016-12-17 MED ORDER — ONDANSETRON HCL 4 MG/2ML IJ SOLN
INTRAMUSCULAR | Status: AC
  Filled 2016-12-17: qty 2

## 2016-12-17 MED ORDER — MEPERIDINE HCL 100 MG/ML IJ SOLN
INTRAMUSCULAR | Status: DC | PRN
Start: 2016-12-17 — End: 2016-12-17
  Administered 2016-12-17 (×2): 25 mg via INTRAVENOUS
  Administered 2016-12-17: 50 mg via INTRAVENOUS
  Administered 2016-12-17: 25 mg via INTRAVENOUS

## 2016-12-17 MED ORDER — ONDANSETRON HCL 4 MG/2ML IJ SOLN
4.0000 mg | Freq: Once | INTRAMUSCULAR | Status: AC
  Administered 2016-12-17: 4 mg via INTRAVENOUS

## 2016-12-17 MED ORDER — SODIUM CHLORIDE 0.9 % IV SOLN
INTRAVENOUS | Status: DC
  Administered 2016-12-17: 1000 mL via INTRAVENOUS

## 2016-12-17 MED ORDER — MIDAZOLAM HCL 5 MG/5ML IJ SOLN
INTRAMUSCULAR | Status: AC
  Filled 2016-12-17: qty 10

## 2016-12-17 MED ORDER — STERILE WATER FOR IRRIGATION IR SOLN
Status: DC | PRN
  Administered 2016-12-17: 100 mL

## 2016-12-17 MED ORDER — MIDAZOLAM HCL 5 MG/5ML IJ SOLN
INTRAMUSCULAR | Status: DC | PRN
  Administered 2016-12-17 (×2): 2 mg via INTRAVENOUS
  Administered 2016-12-17 (×2): 1 mg via INTRAVENOUS

## 2016-12-17 MED ORDER — MEPERIDINE HCL 100 MG/ML IJ SOLN
INTRAMUSCULAR | Status: AC
  Filled 2016-12-17: qty 2

## 2016-12-17 NOTE — Op Note (Signed)
[facility] ([place] Patient Name: Erica Davis Procedure Date: 12/17/2016 8:21 AM MRN: [id number] Date of Birth: 02-24-1956 Attending MD: Barney Drain , MD CSN: [id number] Age: 61 Admit Type: Outpatient Procedure:                Colonoscopy, SCREENING Indications:              Screening for colorectal malignant neoplasm Providers:                Barney Drain, MD, Lurline Del, RN, Charlyne Petrin                            RN, RN Referring MD:             Edwinna Areola. [place] Crane MD Medicines:                Meperidine 125 mg IV, Midazolam 6 mg IV Complications:            No immediate complications. Estimated Blood Loss:     Estimated blood loss: none. Procedure:                Pre-Anesthesia Assessment:                           - Prior to the procedure, a History and Physical                            was performed, and patient medications and                            allergies were reviewed. The patient's tolerance of                            previous anesthesia was also reviewed. The risks                            and benefits of the procedure and the sedation                            options and risks were discussed with the patient.                            All questions were answered, and informed consent                            was obtained. Prior Anticoagulants: The patient has                            taken aspirin, last dose was 1 week prior to                            procedure. ASA Grade Assessment: II - A patient                            with mild systemic disease. After reviewing the  risks and benefits, the patient was deemed in                            satisfactory condition to undergo the procedure.                            After obtaining informed consent, the colonoscope                            was passed under direct vision. Throughout the                            procedure, the patient's blood pressure, pulse, and                    oxygen saturations were monitored continuously. The                            EC-[id number] ([id number]) scope was introduced through                            the anus and advanced to the 5 cm into the ileum.                            The colonoscopy was somewhat difficult due to a                            tortuous colon. Successful completion of the                            procedure was aided by increasing the dose of                            sedation medication, straightening and shortening                            the scope to obtain bowel loop reduction and                            COLOWRAP. The patient tolerated the procedure                            fairly well. The quality of the bowel preparation                            was excellent. The terminal ileum, ileocecal valve,                            appendiceal orifice, and rectum were photographed. Scope In: 9:01:22 AM Scope Out: 9:11:56 AM Scope Withdrawal Time: 0 hours 8 minutes 6 seconds  Total Procedure Duration: 0 hours 10 minutes 34 seconds  Findings:      Multiple small and large-mouthed diverticula were found in the       recto-sigmoid colon, sigmoid colon and descending colon.  The recto-sigmoid colon and sigmoid colon were moderately redundant.      External hemorrhoids were found during retroflexion. The hemorrhoids       were moderate. Impression:               - MILD Diverticulosis in the recto-sigmoid colon,                            in the sigmoid colon and in the descending colon.                           - Redundant RECTOSIGMOID colon.                           - Internal hemorrhoids. Moderate Sedation:      Moderate (conscious) sedation was administered by the endoscopy nurse       and supervised by the endoscopist. The following parameters were       monitored: oxygen saturation, heart rate, blood pressure, and response       to care. Total physician intraservice time was 22  minutes. Recommendation:           - Repeat colonoscopy in 10 years for surveillance.                           - High fiber diet.                           - Continue present medications.                           - Patient has a contact number available for                            emergencies. The signs and symptoms of potential                            delayed complications were discussed with the                            patient. Return to normal activities tomorrow.                            Written discharge instructions were provided to the                            patient. Procedure Code(s):        --- Professional ---                           [phone], Colonoscopy, flexible; diagnostic, including                            collection of specimen(s) by brushing or washing,                            when performed (separate procedure)  72620, Moderate sedation services provided by the                            same physician or other qualified health care                            professional performing the diagnostic or                            therapeutic service that the sedation supports,                            requiring the presence of an independent trained                            observer to assist in the monitoring of the                            patient's level of consciousness and physiological                            status; initial 15 minutes of intraservice time,                            patient age 58 years or older Diagnosis Code(s):        --- Professional ---                           Z12.11, Encounter for screening for malignant                            neoplasm of colon                           K64.8, Other hemorrhoids                           K57.30, Diverticulosis of large intestine without                            perforation or abscess without bleeding                           Q43.8, Other specified  congenital malformations of                            intestine CPT copyright 2016 [employer]. All rights reserved. The codes documented in this report are preliminary and upon coder review may  be revised to meet current compliance requirements. Barney Drain, MD Barney Drain, MD 12/17/2016 9:20:56 AM This report has been signed electronically. Number of Addenda: 0

## 2016-12-17 NOTE — Discharge Instructions (Signed)
You have small internal hemorrhoids and diverticulosis IN YOUR LEFT  COLON. YOU DID NOT HAVE ANY POLYPS.    DRINK WATER TO KEEP YOUR URINE LIGHT YELLOW.  CONTINUE YOUR WEIGHT LOSS EFFORTS.  WHILE I DO NOT WANT TO ALARM YOU, YOUR BODY MASS INDEX IS OVER 30 WHICH MEANS YOU ARE OBESE. Obesity activates cancer genes. OBESITY IS ASSOCIATED WITH AN INCREASE RISK FOR ALL CANCERS, INCLUDING ESOPHAGEAL AND COLON CANCER. A WEIGHT OF 170 LBS WILL GET YOUR BODY MASS INDEX(BMI) UNDER 30.  FOLLOW A HIGH FIBER/lactose free DIET. SEE INFO BELOW.  IF YOU CONSUME DAIRY, ADD LACTASE 3 PILLS WITH MEALS UP TO THREE TIMES A DAY.  TAKE  CALCIUM WITH VITAMIN D THREE TIMES A  DAY AFTER A MEAL TO PREVENT BONE LOSS.  Next colonoscopy in 10 years.   Colonoscopy Care After Read the instructions outlined below and refer to this sheet in the next week. These discharge instructions provide you with general information on caring for yourself after you leave the hospital. While your treatment has been planned according to the most current medical practices available, unavoidable complications occasionally occur. If you have any problems or questions after discharge, call DR. Rayan Ines, [phone].  ACTIVITY  You may resume your regular activity, but move at a slower pace for the next 24 hours.   Take frequent rest periods for the next 24 hours.   Walking will help get rid of the air and reduce the bloated feeling in your belly (abdomen).   No driving for 24 hours (because of the medicine (anesthesia) used during the test).   You may shower.   Do not sign any important legal documents or operate any machinery for 24 hours (because of the anesthesia used during the test).    NUTRITION  Drink plenty of fluids.   You may resume your normal diet as instructed by your doctor.   Begin with a light meal and progress to your normal diet. Heavy or fried foods are harder to digest and may make you feel sick to your  stomach (nauseated).   Avoid alcoholic beverages for 24 hours or as instructed.    MEDICATIONS  You may resume your normal medications.   WHAT YOU CAN EXPECT TODAY  Some feelings of bloating in the abdomen.   Passage of more gas than usual.   Spotting of blood in your stool or on the toilet paper  .  IF YOU HAD POLYPS REMOVED DURING THE COLONOSCOPY:  Eat a soft diet IF YOU HAVE NAUSEA, BLOATING, ABDOMINAL PAIN, OR VOMITING.    FINDING OUT THE RESULTS OF YOUR TEST Not all test results are available during your visit. DR. Oneida Alar WILL CALL YOU WITHIN 14 DAYS OF YOUR PROCEDUE WITH YOUR RESULTS. Do not assume everything is normal if you have not heard from DR. Lauralye Kinn, CALL HER OFFICE AT [phone].  SEEK IMMEDIATE MEDICAL ATTENTION AND CALL THE OFFICE: [phone] IF:  You have more than a spotting of blood in your stool.   Your belly is swollen (abdominal distention).   You are nauseated or vomiting.   You have a temperature over 101F.   You have abdominal pain or discomfort that is severe or gets worse throughout the day.  High-Fiber Diet A high-fiber diet changes your normal diet to include more whole grains, legumes, fruits, and vegetables. Changes in the diet involve replacing refined carbohydrates with unrefined foods. The calorie level of the diet is essentially unchanged. The Dietary Reference Intake (recommended amount) for  adult males is 38 grams per day. For adult females, it is 25 grams per day. Pregnant and lactating women should consume 28 grams of fiber per day. Fiber is the intact part of a plant that is not broken down during digestion. Functional fiber is fiber that has been isolated from the plant to provide a beneficial effect in the body. PURPOSE  Increase stool bulk.   Ease and regulate bowel movements.   Lower cholesterol.   REDUCE RISK OF COLON CANCER  INDICATIONS THAT YOU NEED MORE FIBER  Constipation and hemorrhoids.   Uncomplicated  diverticulosis (intestine condition) and irritable bowel syndrome.   Weight management.   As a protective measure against hardening of the arteries (atherosclerosis), diabetes, and cancer.   GUIDELINES FOR INCREASING FIBER IN THE DIET  Start adding fiber to the diet slowly. A gradual increase of about 5 more grams (2 slices of whole-wheat bread, 2 servings of most fruits or vegetables, or 1 bowl of high-fiber cereal) per day is best. Too rapid an increase in fiber may result in constipation, flatulence, and bloating.   Drink enough water and fluids to keep your urine clear or pale yellow. Water, juice, or caffeine-free drinks are recommended. Not drinking enough fluid may cause constipation.   Eat a variety of high-fiber foods rather than one type of fiber.   Try to increase your intake of fiber through using high-fiber foods rather than fiber pills or supplements that contain small amounts of fiber.   The goal is to change the types of food eaten. Do not supplement your present diet with high-fiber foods, but replace foods in your present diet.   INCLUDE A VARIETY OF FIBER SOURCES  Replace refined and processed grains with whole grains, canned fruits with fresh fruits, and incorporate other fiber sources. White rice, white breads, and most bakery goods contain little or no fiber.   Brown whole-grain rice, buckwheat oats, and many fruits and vegetables are all good sources of fiber. These include: broccoli, Brussels sprouts, cabbage, cauliflower, beets, sweet potatoes, white potatoes (skin on), carrots, tomatoes, eggplant, squash, berries, fresh fruits, and dried fruits.   Cereals appear to be the richest source of fiber. Cereal fiber is found in whole grains and bran. Bran is the fiber-rich outer coat of cereal grain, which is largely removed in refining. In whole-grain cereals, the bran remains. In breakfast cereals, the largest amount of fiber is found in those with "bran" in their names.  The fiber content is sometimes indicated on the label.   You may need to include additional fruits and vegetables each day.   In baking, for 1 cup white flour, you may use the following substitutions:   1 cup whole-wheat flour minus 2 tablespoons.   1/2 cup white flour plus 1/2 cup whole-wheat flour.   CONTINUE PROTONIX. TAKE 30 MINUTES PRIOR TO MEALS TWICE DAILY FOR 3 MOS. YOU MAY TRY ONCE DAILY DOSING AFTER June 5.  FOLLOW A DAIRY FREE DIET FOR ONE MONTH. SEE INFO BELOW.  CONSIDER ACTIVIA DAILY BUT AVOID IF WORSENS LOOSE STOOLS. CONSIDER A PROBIOTIC DAILY IF YOGURT DOES NOT HELP(PHILLIP'S COLON HEALTH, [place] [facility], OR ALIGN)  CARRY CANDY FOR EPISODE WHEN YOUR SUGAR DROPS.   IF YOU FEEL LIKE EATING DAIRY, TAKE LACTASE PILLS, EAT LACTOSE FREE ICE CREAM, OR DRINK LACTAID MILK.  FOLLOW UP IN June 2014.   Lactose Free Diet Lactose is a carbohydrate that is found mainly in milk and milk products, as well as in foods with  added milk or whey. Lactose must be digested by the enzyme in order to be used by the body. Lactose intolerance occurs when there is a shortage of lactase. When your body is not able to digest lactose, you may feel sick to your stomach (nausea), bloating, cramping, gas and diarrhea.  There are many dairy products that may be tolerated better than milk by some people:  The use of cultured dairy products such as yogurt, buttermilk, cottage cheese, and sweet acidophilus milk (Kefir) for lactase-deficient individuals is usually well tolerated. This is because the healthy bacteria help digest lactose.   Lactose-hydrolyzed milk (Lactaid) contains 40-90% less lactose than milk and may also be well tolerated.    SPECIAL NOTES  Lactose is a carbohydrates. The major food source is dairy products. Reading food labels is important. Many products contain lactose even when they are not made from milk. Look for the following words: whey, milk solids, dry milk solids, nonfat dry  milk powder. Typical sources of lactose other than dairy products include breads, candies, cold cuts, prepared and processed foods, and commercial sauces and gravies.   All foods must be prepared without milk, cream, or other dairy foods.   Soy milk and lactose-free supplements (LACTASE) may be used as an alternative to milk.   FOOD GROUP ALLOWED/RECOMMENDED AVOID/USE SPARINGLY  BREADS / STARCHES 4 servings or more* Breads and rolls made without milk. [place], [place], or [place] bread. Breads and rolls that contain milk. Prepared mixes such as muffins, biscuits, waffles, pancakes. Sweet rolls, donuts, [place] toast (if made with milk or lactose).  Crackers: Soda crackers, graham crackers. Any crackers prepared without lactose. Zwieback crackers, corn curls, or any that contain lactose.  Cereals: Cooked or dry cereals prepared without lactose (read labels). Cooked or dry cereals prepared with lactose (read labels). Total, Cocoa Krispies. Special K.  Potatoes / Pasta / Rice: Any prepared without milk or lactose. Popcorn. Instant potatoes, frozen [place] fries, scalloped or au gratin potatoes.  VEGETABLES 2 servings or more Fresh, frozen, and canned vegetables. Creamed or breaded vegetables. Vegetables in a cheese sauce or with lactose-containing margarines.  FRUIT 2 servings or more All fresh, canned, or frozen fruits that are not processed with lactose. Any canned or frozen fruits processed with lactose.  MEAT & SUBSTITUTES 2 servings or more (4 to 6 oz. total per day) Plain beef, chicken, fish, [place], lamb, veal, pork, or ham. Kosher prepared meat products. Strained or junior meats that do not contain milk. Eggs, soy meat substitutes, nuts. Scrambled eggs, omelets, and souffles that contain milk. Creamed or breaded meat, fish, or fowl. Sausage products such as wieners, liver sausage, or cold cuts that contain milk solids. Cheese, cottage cheese, or cheese spreads.  MILK None. (See BEVERAGES for  milk substitutes. See DESSERTS for ice cream and frozen desserts.) Milk (whole, 2%, skim, or chocolate). Evaporated, powdered, or condensed milk; malted milk.  SOUPS & COMBINATION FOODS Bouillon, broth, vegetable soups, clear soups, consomms. Homemade soups made with allowed ingredients. Combination or prepared foods that do not contain milk or milk products (read labels). Cream soups, chowders, commercially prepared soups containing lactose. Macaroni and cheese, pizza. Combination or prepared foods that contain milk or milk products.  DESSERTS & SWEETS In moderation Water and fruit ices; gelatin; angel food cake. Homemade cookies, pies, or cakes made from allowed ingredients. Pudding (if made with water or a milk substitute). Lactose-free tofu desserts. Sugar, honey, corn syrup, jam, jelly; marmalade; molasses (beet sugar); Pure sugar candy; marshmallows.  Ice cream, ice milk, sherbet, custard, pudding, frozen yogurt. Commercial cake and cookie mixes. Desserts that contain chocolate. Pie crust made with milk-containing margarine; reduced-calorie desserts made with a sugar substitute that contains lactose. Toffee, peppermint, butterscotch, chocolate, caramels.  FATS & OILS In moderation Butter (as tolerated; contains very small amounts of lactose). Dressings that do not contain milk, Vegetable oils, shortening, Miracle Whip, mayonnaise, nondairy cream & whipped toppings without lactose or milk solids added (examples: Coffee Rich, Carnation Coffeemate, Rich's Whipped Topping, PolyRich). Berniece Salines. Margarines and salad dressings containing milk; cream, cream cheese; peanut butter with added milk solids, sour cream, chip dips, made with sour cream.  BEVERAGES Carbonated drinks; tea; coffee and freeze-dried coffee; some instant coffees (check labels). Fruit drinks; fruit and vegetable juice; Rice or Soy milk. Ovaltine, hot chocolate. Some cocoas; some instant coffees; instant iced teas; powdered fruit drinks  (read labels).   CONDIMENTS / MISCELLANEOUS Soy sauce, carob powder, olives, gravy made with water, baker's cocoa, pickles, pure seasonings and spices, wine, pure monosodium glutamate, catsup, mustard. Some chewing gums, chocolate, some cocoas. Certain antibiotics and vitamin / mineral preparations. Spice blends if they contain milk products. MSG extender. Artificial sweeteners that contain lactose such as Equal (Nutra-Sweet) and Sweet 'n Low. Some nondairy creamers (read labels).   SAMPLE MENU*  Breakfast   Orange Juice.  Banana.   Bran flakes.   Nondairy Creamer.  [place] Bread (toasted).   Butter or milk-free margarine.   Coffee or tea.    Noon Meal   Chicken Breast.  Rice.   Green beans.   Butter   Fresh melon.   Coffee or tea.    Evening Meal   Roast Beef.  Baked potato.   Butter   Broccoli.   Lettuce salad with vinegar and oil dressing.  [employer]   Coffee or tea.      Diverticulosis Diverticulosis is a common condition that develops when small pouches (diverticula) form in the wall of the colon. The risk of diverticulosis increases with age. It happens more often in people who eat a low-fiber diet. Most individuals with diverticulosis have no symptoms. Those individuals with symptoms usually experience belly (abdominal) pain, constipation, or loose stools (diarrhea).  HOME CARE INSTRUCTIONS  Increase the amount of fiber in your diet as directed by your caregiver or dietician. This may reduce symptoms of diverticulosis.   Drink at least 6 to 8 glasses of water each day to prevent constipation.   Try not to strain when you have a bowel movement.   Avoiding nuts and seeds to prevent complications is NOT NECESSARY.   FOODS HAVING HIGH FIBER CONTENT INCLUDE:  Fruits. Apple, peach, pear, tangerine, raisins, prunes.   Vegetables. Brussels sprouts, asparagus, broccoli, cabbage, carrot, cauliflower, romaine lettuce, spinach, summer squash,  tomato, winter squash, zucchini.   Starchy Vegetables. Baked beans, kidney beans, lima beans, split peas, lentils, potatoes (with skin).   Grains. Whole wheat bread, brown rice, bran flake cereal, plain oatmeal, white rice, shredded wheat, bran muffins.    SEEK IMMEDIATE MEDICAL CARE IF:  You develop increasing pain or severe bloating.   You have an oral temperature above 101F.   You develop vomiting or bowel movements that are bloody or black.   Hemorrhoids Hemorrhoids are dilated (enlarged) veins around the rectum. Sometimes clots will form in the veins. This makes them swollen and painful. These are called thrombosed hemorrhoids. Causes of hemorrhoids include:  Constipation.   Straining to have a bowel movement.   HEAVY  LIFTING  HOME CARE INSTRUCTIONS  Eat a well balanced diet and drink 6 to 8 glasses of water every day to avoid constipation. You may also use a bulk laxative.   Avoid straining to have bowel movements.   Keep anal area dry and clean.   Do not use a donut shaped pillow or sit on the toilet for long periods. This increases blood pooling and pain.   Move your bowels when your body has the urge; this will require less straining and will decrease pain and pressure.

## 2016-12-17 NOTE — H&P (Signed)
Primary Care Physician:  Celene Squibb, MD Primary Gastroenterologist:  Dr. Oneida Alar  Pre-Procedure History & Physical: HPI:  Erica Davis is a 61 y.o. female here for [place].  Past Medical History:  Diagnosis Date  . Elevated BP 03/19/2013    Past Surgical History:  Procedure Laterality Date  . [place]  . TOTAL KNEE ARTHROPLASTY Right 01/01/2015   Procedure: RIGHT TOTAL KNEE REPLACEMENT;  Surgeon: Carole Civil, MD;  Location: [facility];  Service: Orthopedics;  Laterality: Right;    Prior to Admission medications   Medication Sig Start Date End Date Taking? Authorizing Provider  acetaminophen (TYLENOL) 500 MG tablet Take 500-1,000 mg by mouth 2 (two) times daily as needed for moderate pain or headache.   Yes [provider]  Aspirin-Acetaminophen-Caffeine (GOODY HEADACHE PO) Take 1 packet by mouth 2 (two) times daily as needed (headache).   Yes [provider]  bismuth subsalicylate (PEPTO BISMOL) 262 MG/15ML suspension Take 30 mLs by mouth every 6 (six) hours as needed for indigestion or diarrhea or loose stools.   Yes [provider]  Calcium-Magnesium-Vitamin D (CALCIUM 1200+D3 PO) Take 1 tablet by mouth 2 (two) times daily.   Yes [provider]  cetirizine (ZYRTEC) 10 MG tablet Take 10 mg by mouth daily as needed for allergies.   Yes [provider]  Coenzyme Q10 (CO Q 10) 100 MG CAPS Take 100 mg by mouth daily.    Yes [provider]  Glucos-Chond-Hyal Ac-Ca Fructo (MOVE FREE JOINT HEALTH ADVANCE) TABS Take 1 tablet by mouth daily.   Yes [provider]  Magnesium 500 MG TABS Take 500 mg by mouth daily.   Yes [provider]  Multiple Vitamin (MULTIVITAMIN) tablet Take 1 tablet by mouth daily.   Yes [provider]  neomycin-bacitracin-polymyxin (NEOSPORIN) ointment Apply 1 application topically as needed for wound care.   Yes [provider]  OVER THE COUNTER  MEDICATION Take 1 tablet by mouth 2 (two) times daily as needed (ibs symptoms). IBgard otc stomach medicine   Yes [provider]  polyethylene glycol-electrolytes (TRILYTE) 420 g solution Take 4,000 mLs by mouth as directed. 11/04/16  Yes Makynzee Tigges, Marga Melnick, MD  pseudoephedrine-guaifenesin (MUCINEX D) 60-600 MG 12 hr tablet Take 1 tablet by mouth every 12 (twelve) hours as needed for congestion.   Yes [provider]  vitamin C (ASCORBIC ACID) 500 MG tablet Take 500 mg by mouth daily.   Yes [provider]  white petrolatum (VASELINE) GEL Apply 1 application topically as needed (sores).   Yes [provider]    Allergies as of 11/04/2016  . (No Known Allergies)    Family History  Problem Relation Age of Onset  . Macular degeneration Father   . Cancer Maternal Aunt        breast  . Cancer Mother        ovarian    Social History   Social History  . Marital status: Married    Spouse name: N/A  . Number of children: N/A  . Years of education: N/A   Occupational History  . Not on file.   Social History Main Topics  . Smoking status: Never Smoker  . Smokeless tobacco: Never Used  . Alcohol use No  . Drug use: No  . Sexual activity: Yes    Birth control/ protection: Post-menopausal   Other Topics Concern  . Not on file   Social History Narrative  .  No narrative on file    Review of Systems: See HPI, otherwise negative ROS   Physical Exam: BP (!) 145/72   Pulse (!) 58   Temp 97.6 F (36.4 C) (Oral)   Resp (!) 21   Ht 5\' 4"  (1.626 m)   Wt 190 lb (86.2 kg)   SpO2 100%   BMI 32.61 kg/m  General:   Alert,  pleasant and cooperative in NAD Head:  Normocephalic and atraumatic. Neck:  Supple; Lungs:  Clear throughout to auscultation.    Heart:  Regular rate and rhythm. Abdomen:  Soft, nontender and nondistended. Normal bowel sounds, without guarding, and without rebound.   Neurologic:  Alert and  oriented x4;  grossly normal  neurologically.  Impression/Plan:     SCREENING  Plan:  1. TCS TODAY. DISCUSSED PROCEDURE, BENEFITS, & RISKS: < 1% chance of medication reaction, bleeding, perforation, or rupture of spleen/liver.

## 2016-12-21 ENCOUNTER — Encounter (HOSPITAL_COMMUNITY): Payer: Self-pay | Admitting: Gastroenterology

## 2016-12-23 ENCOUNTER — Other Ambulatory Visit: Payer: Self-pay | Admitting: Radiology

## 2016-12-23 DIAGNOSIS — Z96651 Presence of right artificial knee joint: Secondary | ICD-10-CM

## 2016-12-24 ENCOUNTER — Encounter: Payer: Self-pay | Admitting: Orthopedic Surgery

## 2016-12-24 ENCOUNTER — Ambulatory Visit (INDEPENDENT_AMBULATORY_CARE_PROVIDER_SITE_OTHER): Payer: PRIVATE HEALTH INSURANCE

## 2016-12-24 ENCOUNTER — Ambulatory Visit (INDEPENDENT_AMBULATORY_CARE_PROVIDER_SITE_OTHER): Payer: PRIVATE HEALTH INSURANCE | Admitting: Orthopedic Surgery

## 2016-12-24 VITALS — BP 95/59 | HR 80 | Resp 14 | Ht 63.5 in | Wt 190.0 lb

## 2016-12-24 DIAGNOSIS — Z96651 Presence of right artificial knee joint: Secondary | ICD-10-CM | POA: Diagnosis not present

## 2016-12-24 NOTE — Progress Notes (Signed)
ANNUAL FOLLOW UP FOR  right TKA   Chief Complaint  Patient presents with  . Post-op Follow-up    2 Yrs s/p TKR      HPI: The patient is here for the annual  follow-up x-ray for knee replacement   ROS  No mechanical symptoms of catching locking or giving way  Examination of the right KNEE   Inspection shows : incision healed nicely without erythema, no tenderness no swelling  Range of motion total range of motion is 120  Stability the knee is stable anterior to posterior as well as medial to lateral  Strength quadriceps strength is normal  Skin no erythema around the skin incision  Cardiovascular NO EDEMA    Medical decision-making section  X-rays ordered with the following personal interpretation  Normal alignment without loosening   Diagnosis  Encounter Diagnosis  Name Primary?  . S/P total knee replacement, right Yes     Plan follow-up 1 year repeat x-rays

## 2016-12-28 ENCOUNTER — Ambulatory Visit: Payer: PRIVATE HEALTH INSURANCE | Admitting: Orthopedic Surgery

## 2017-01-25 ENCOUNTER — Other Ambulatory Visit (HOSPITAL_COMMUNITY)
Admission: RE | Admit: 2017-01-25 | Discharge: 2017-01-25 | Disposition: A | Discharge status: Home / Self Care | Payer: PRIVATE HEALTH INSURANCE | Source: Ambulatory Visit | Attending: Adult Health | Admitting: Adult Health

## 2017-01-25 ENCOUNTER — Ambulatory Visit (INDEPENDENT_AMBULATORY_CARE_PROVIDER_SITE_OTHER): Payer: PRIVATE HEALTH INSURANCE | Admitting: Adult Health

## 2017-01-25 ENCOUNTER — Encounter: Payer: Self-pay | Admitting: Adult Health

## 2017-01-25 VITALS — BP 150/82 | HR 69 | Ht 64.0 in | Wt 190.0 lb

## 2017-01-25 DIAGNOSIS — Z01419 Encounter for gynecological examination (general) (routine) without abnormal findings: Secondary | ICD-10-CM

## 2017-01-25 DIAGNOSIS — Z01411 Encounter for gynecological examination (general) (routine) with abnormal findings: Secondary | ICD-10-CM

## 2017-01-25 DIAGNOSIS — N841 Polyp of cervix uteri: Secondary | ICD-10-CM | POA: Insufficient documentation

## 2017-01-25 DIAGNOSIS — Z1212 Encounter for screening for malignant neoplasm of rectum: Secondary | ICD-10-CM

## 2017-01-25 DIAGNOSIS — Z1211 Encounter for screening for malignant neoplasm of colon: Secondary | ICD-10-CM | POA: Insufficient documentation

## 2017-01-25 LAB — HEMOCCULT GUIAC POC 1CARD (OFFICE): Fecal Occult Blood, POC: NEGATIVE

## 2017-01-25 NOTE — Patient Instructions (Addendum)
Return 9/27 for cervical polyp removal Physical in 1 year Pap in 3 if normal Mammogram yearly Labs with city  Colonoscopy per GI

## 2017-01-25 NOTE — Progress Notes (Signed)
Subjective:     Patient ID: Erica Davis, female   DOB: 1956-01-26, 61 y.o.   MRN: [id number]  HPI Erica Davis is a 61 year old white female, married in for pap smear and pelvic exam, she had physical at PCP in June. PCP is Dr Nevada Crane.   Review of Systems Patient denies any headaches, hearing loss, fatigue, blurred vision, shortness of breath, chest pain, abdominal pain, problems with bowel movements, urination, or intercourse. No joint pain or mood swings. Reviewed past medical,surgical, social and family history. Reviewed medications and allergies.     Objective:   Physical Exam BP (!) 150/82 (BP Location: Left Arm, Patient Position: Sitting, Cuff Size: Normal)   Pulse 69   Ht 5\' 4"  (1.626 m)   Wt 190 lb (86.2 kg)   BMI 32.61 kg/m  Skin warm and dry.Pelvic: external genitalia is normal in appearance no lesions, vagina: pale pink with loss of color, moisture and rugae,urethra has no lesions or masses noted, cervix:has small cervical polyp at os, otherwise smooth, pap with HPV performed, uterus: normal size, shape and contour, non tender, no masses felt, adnexa: no masses or tenderness noted. Bladder is non tender and no masses felt   Rectal exam, shows good tone, no masses and hemoccult negative.  Assessment:     1. Encounter for gynecological examination with Papanicolaou smear of cervix   2. Cervical polyp   3. Screening for colorectal cancer       Plan:       Pap with HPV sent Return 9/27 for cervical polyp removal Physical in 1 year Pap in 3 if normal Mammogram yearly Labs with city  Colonoscopy per GI

## 2017-01-27 LAB — CYTOLOGY - PAP
Diagnosis: NEGATIVE
HPV: NOT DETECTED

## 2017-02-08 ENCOUNTER — Ambulatory Visit (INDEPENDENT_AMBULATORY_CARE_PROVIDER_SITE_OTHER): Payer: PRIVATE HEALTH INSURANCE | Admitting: Adult Health

## 2017-02-08 ENCOUNTER — Encounter: Payer: Self-pay | Admitting: Adult Health

## 2017-02-08 VITALS — BP 158/98 | HR 78 | Ht 64.0 in | Wt 190.0 lb

## 2017-02-08 DIAGNOSIS — N841 Polyp of cervix uteri: Secondary | ICD-10-CM | POA: Diagnosis not present

## 2017-02-08 NOTE — Addendum Note (Signed)
Addended by: Linton Rump on: 02/08/2017 11:17 AM   Modules accepted: Orders

## 2017-02-08 NOTE — Patient Instructions (Signed)
Follow up prn

## 2017-02-08 NOTE — Progress Notes (Signed)
Subjective:     Patient ID: Erica Davis, female   DOB: 05-04-1956, 61 y.o.   MRN: [id number]  HPI Erica Davis is a 61 year old white female in for cervical polyp removal.  Review of Systems For cervical polyp removal Reviewed past medical,surgical, social and family history. Reviewed medications and allergies.     Objective:   Physical Exam BP (!) 158/98 (BP Location: Left Arm, Patient Position: Sitting, Cuff Size: Normal)   Pulse 78   Ht 5\' 4"  (1.626 m)   Wt 190 lb (86.2 kg)   BMI 32.61 kg/m Consent signed,Skin warm and dry.Pelvic: external genitalia is normal in appearance no lesions, vagina: pale with loss of moisture and rugae,urethra has no lesions or masses noted, cervix:  small cervical polyp at os, easily removed with forceps, os stenotic.    Assessment:     Cervical polyp, removed    Plan:     Cervical polyp to path. F/U prn

## 2017-02-10 ENCOUNTER — Telehealth: Payer: Self-pay | Admitting: Adult Health

## 2017-02-10 NOTE — Telephone Encounter (Signed)
Pt aware path benign on cervical polyp

## 2017-12-30 ENCOUNTER — Ambulatory Visit: Payer: Self-pay | Admitting: Orthopedic Surgery

## 2019-01-30 ENCOUNTER — Encounter: Payer: Self-pay | Admitting: Adult Health

## 2019-07-27 ENCOUNTER — Other Ambulatory Visit: Payer: Self-pay | Admitting: Internal Medicine

## 2019-07-27 DIAGNOSIS — R310 Gross hematuria: Secondary | ICD-10-CM

## 2019-07-27 DIAGNOSIS — R1030 Lower abdominal pain, unspecified: Secondary | ICD-10-CM

## 2019-08-09 ENCOUNTER — Ambulatory Visit (HOSPITAL_COMMUNITY)
Admission: RE | Admit: 2019-08-09 | Discharge: 2019-08-09 | Disposition: A | Discharge status: Home / Self Care | Payer: PRIVATE HEALTH INSURANCE | Source: Ambulatory Visit | Attending: Internal Medicine | Admitting: Internal Medicine

## 2019-08-09 ENCOUNTER — Other Ambulatory Visit: Payer: Self-pay

## 2019-08-09 DIAGNOSIS — R310 Gross hematuria: Secondary | ICD-10-CM | POA: Diagnosis not present

## 2019-08-09 DIAGNOSIS — R1030 Lower abdominal pain, unspecified: Secondary | ICD-10-CM | POA: Insufficient documentation

## 2019-08-24 ENCOUNTER — Telehealth: Payer: Self-pay | Admitting: Urology

## 2019-08-24 NOTE — Telephone Encounter (Signed)
Ginger @ Dr Danelle Berry office called and asked if we could get this pt in any sooner. She is scheduled for 5/5/ with Dr Alyson Ingles. Apparently she is in lots of pain and has been diagnosed with a stone since the referral was made.

## 2019-08-24 NOTE — Telephone Encounter (Signed)
Spoke with pt. MD had cancellation in surgery scheduled. Placed pt on 4/14 at 9:15 am

## 2019-08-29 ENCOUNTER — Ambulatory Visit (INDEPENDENT_AMBULATORY_CARE_PROVIDER_SITE_OTHER): Payer: Medicare Other | Admitting: Urology

## 2019-08-29 ENCOUNTER — Other Ambulatory Visit: Payer: Self-pay

## 2019-08-29 ENCOUNTER — Encounter: Payer: Self-pay | Admitting: Urology

## 2019-08-29 VITALS — BP 169/81 | HR 71 | Temp 96.4°F | Ht 64.5 in | Wt 181.0 lb

## 2019-08-29 DIAGNOSIS — R319 Hematuria, unspecified: Secondary | ICD-10-CM

## 2019-08-29 DIAGNOSIS — N200 Calculus of kidney: Secondary | ICD-10-CM | POA: Diagnosis not present

## 2019-08-29 LAB — POCT URINALYSIS DIPSTICK
Bilirubin, UA: NEGATIVE
Glucose, UA: NEGATIVE
Ketones, UA: NEGATIVE
Nitrite, UA: NEGATIVE
Protein, UA: POSITIVE — AB
Spec Grav, UA: <=1.005 — AB (ref 1.010–1.025)
Urobilinogen, UA: 0.2 E.U./dL
pH, UA: 5 (ref 5.0–8.0)

## 2019-08-29 NOTE — Patient Instructions (Signed)
Lithotripsy  Lithotripsy is a treatment that can sometimes help eliminate kidney stones and the pain that they cause. A form of lithotripsy, also known as extracorporeal shock wave lithotripsy, is a nonsurgical procedure that crushes a kidney stone with shock waves. These shock waves pass through your body and focus on the kidney stone. They cause the kidney stone to break up while it is still in the urinary tract. This makes it easier for the smaller pieces of stone to pass in the urine. Tell a health care provider about:  Any allergies you have.  All medicines you are taking, including vitamins, herbs, eye drops, creams, and over-the-counter medicines.  Any blood disorders you have.  Any surgeries you have had.  Any medical conditions you have.  Whether you are pregnant or may be pregnant.  Any problems you or family members have had with anesthetic medicines. What are the risks? Generally, this is a safe procedure. However, problems may occur, including:  Infection.  Bleeding of the kidney.  Bruising of the kidney or skin.  Scarring of the kidney, which can lead to: ? Increased blood pressure. ? Poor kidney function. ? Return (recurrence) of kidney stones.  Damage to other structures or organs, such as the liver, colon, spleen, or pancreas.  Blockage (obstruction) of the the tube that carries urine from the kidney to the bladder (ureter).  Failure of the kidney stone to break into pieces (fragments). What happens before the procedure? Staying hydrated Follow instructions from your health care provider about hydration, which may include:  Up to 2 hours before the procedure - you may continue to drink clear liquids, such as water, clear fruit juice, black coffee, and plain tea. Eating and drinking restrictions Follow instructions from your health care provider about eating and drinking, which may include:  8 hours before the procedure - stop eating heavy meals or foods  such as meat, fried foods, or fatty foods.  6 hours before the procedure - stop eating light meals or foods, such as toast or cereal.  6 hours before the procedure - stop drinking milk or drinks that contain milk.  2 hours before the procedure - stop drinking clear liquids. General instructions  Plan to have someone take you home from the hospital or clinic.  Ask your health care provider about: ? Changing or stopping your regular medicines. This is especially important if you are taking diabetes medicines or blood thinners. ? Taking medicines such as aspirin and ibuprofen. These medicines and other NSAIDs can thin your blood. Do not take these medicines for 7 days before your procedure if your health care provider instructs you not to.  You may have tests, such as: ? Blood tests. ? Urine tests. ? Imaging tests, such as a CT scan. What happens during the procedure?  To lower your risk of infection: ? Your health care team will wash or sanitize their hands. ? Your skin will be washed with soap.  An IV tube will be inserted into one of your veins. This tube will give you fluids and medicines.  You will be given one or more of the following: ? A medicine to help you relax (sedative). ? A medicine to make you fall asleep (general anesthetic).  A water-filled cushion may be placed behind your kidney or on your abdomen. In some cases you may be placed in a tub of lukewarm water.  Your body will be positioned in a way that makes it easy to target the kidney   stone.  A flexible tube with holes in it (stent) may be placed in the ureter. This will help keep urine flowing from the kidney if the fragments of the stone have been blocking the ureter.  An X-ray or ultrasound exam will be done to locate your stone.  Shock waves will be aimed at the stone. If you are awake, you may feel a tapping sensation as the shock waves pass through your body. The procedure may vary among health care  providers and hospitals. What happens after the procedure?  You may have an X-ray to see whether the procedure was able to break up the kidney stone and how much of the stone has passed. If large stone fragments remain after treatment, you may need to have a second procedure at a later time.  Your blood pressure, heart rate, breathing rate, and blood oxygen level will be monitored until the medicines you were given have worn off.  You may be given antibiotics or pain medicine as needed.  If a stent was placed in your ureter during surgery, it may stay in place for a few weeks.  You may need strain your urine to collect pieces of the kidney stone for testing.  You will need to drink plenty of water.  Do not drive for 24 hours if you were given a sedative. Summary  Lithotripsy is a treatment that can sometimes help eliminate kidney stones and the pain that they cause.  A form of lithotripsy, also known as extracorporeal shock wave lithotripsy, is a nonsurgical procedure that crushes a kidney stone with shock waves.  Generally, this is a safe procedure. However, problems may occur, including damage to the kidney or other organs, infection, or obstruction of the tube that carries urine from the kidney to the bladder (ureter).  When you go home, you will need to drink plenty of water. You may be asked to strain your urine to collect pieces of the kidney stone for testing. This information is not intended to replace advice given to you by your health care provider. Make sure you discuss any questions you have with your health care provider. Document Revised: 08/14/2018 Document Reviewed: 03/24/2016 Elsevier Patient Education  2020 [employer] Inc.  

## 2019-08-29 NOTE — Progress Notes (Signed)
08/29/2019 9:39 AM   Erica Davis Erica Davis 30-Jun-1955 [id number]  Referring provider: Celene Squibb, MD [address],  [place] [address]  Left flank pain  HPI: Erica Davis is a 64yo here for evaluation of gross hematuria and nephrolithiasis. She has been having intermittent left flank pain since January. Pain is sharp, intermittent, mild and nonradiating. She notes worsening flank pa with soda consumption. She has intermittent gross hematuria. No hx of nephrolithiasis. She underwent Ct on 3/25 with Dr. Nevada Crane and was found to have a left 1.6cm UPJ calculus and a right 50mm lower pole calculus. No other associated symptoms.. No fevers/chills/sweats.    PMH: Past Medical History:  Diagnosis Date  . Elevated BP 03/19/2013  . Lactose intolerance     Surgical History: Past Surgical History:  Procedure Laterality Date  . [place]  . COLONOSCOPY N/A 12/17/2016   Procedure: COLONOSCOPY;  Surgeon: Danie Binder, MD;  Location: [facility];  Service: Endoscopy;  Laterality: N/A;  8:30am  . TOTAL KNEE ARTHROPLASTY Right 01/01/2015   Procedure: RIGHT TOTAL KNEE REPLACEMENT;  Surgeon: Carole Civil, MD;  Location: [facility] ORS;  Service: Orthopedics;  Laterality: Right;    Home Medications:  Allergies as of 08/29/2019   No Known Allergies     Medication List       Accurate as of August 29, 2019  9:39 AM. If you have any questions, ask your nurse or doctor.        acetaminophen 500 MG tablet Commonly known as: TYLENOL Take 500-1,000 mg by mouth 2 (two) times daily as needed for moderate pain or headache.   bismuth subsalicylate [id number] suspension Commonly known as: PEPTO BISMOL Take 30 mLs by mouth every 6 (six) hours as needed for indigestion or diarrhea or loose stools.   CALCIUM 1200+D3 PO Take 1 tablet by mouth 2 (two) times daily.   cetirizine 10 MG tablet Commonly known as: ZYRTEC Take 10 mg by mouth daily as needed for allergies.   Co Q 10 100 MG  Caps Take 100 mg by mouth daily.   GOODY HEADACHE PO Take 1 packet by mouth as needed (headache).   levocetirizine 5 MG tablet Commonly known as: XYZAL Take 5 mg by mouth at bedtime.   Magnesium 500 MG Tabs Take 500 mg by mouth daily.   Move Free Joint Health Advance Tabs Take 2 tablets by mouth daily.   multivitamin tablet Take 1 tablet by mouth daily.   neomycin-bacitracin-polymyxin ointment Commonly known as: NEOSPORIN Apply 1 application topically as needed for wound care.   pseudoephedrine-guaifenesin 60-600 MG 12 hr tablet Commonly known as: MUCINEX D Take 1 tablet by mouth every 12 (twelve) hours as needed for congestion.   Vaseline Gel Generic drug: white petrolatum Apply 1 application topically as needed (sores).   vitamin C 500 MG tablet Commonly known as: ASCORBIC ACID Take 500 mg by mouth daily.   VITAMIN D PO Take 3,000 mg by mouth.       Allergies: No Known Allergies  Family History: Family History  Problem Relation Age of Onset  . Macular degeneration Father   . Cancer Maternal Aunt        breast  . Cancer Mother        ovarian    Social History:  reports that she has never smoked. She has never used smokeless tobacco. She reports that she does not drink alcohol or use drugs.  ROS: All other  review of systems were reviewed and are negative except what is noted above in HPI  Physical Exam: BP (!) 169/81   Pulse 71   Temp (!) 96.4 F (35.8 C)   Ht 5' 4.5" (1.638 m)   Wt 181 lb (82.1 kg)   BMI 30.59 kg/m   Constitutional:  Alert and oriented, No acute distress. HEENT: [place] AT, moist mucus membranes.  Trachea midline, no masses. Cardiovascular: No clubbing, cyanosis, or edema. Respiratory: Normal respiratory effort, no increased work of breathing. GI: Abdomen is soft, nontender, nondistended, no abdominal masses GU: No CVA tenderness. Circumcised phallus. No masses/lesions on penis, testis, scrotum. Prostate 40g smooth no nodules no  induration.  Lymph: No cervical or inguinal lymphadenopathy. Skin: No rashes, bruises or suspicious lesions. Neurologic: Grossly intact, no focal deficits, moving all 4 extremities. Psychiatric: Normal mood and affect.  Laboratory Data: Lab Results  Component Value Date   WBC 7.8 01/03/2015   HGB 11.8 (L) 01/03/2015   HCT 36.1 01/03/2015   MCV 95.0 01/03/2015   PLT 208 01/03/2015    Lab Results  Component Value Date   CREATININE 0.60 01/02/2015    No results found for: PSA  No results found for: TESTOSTERONE  No results found for: HGBA1C  Urinalysis    Component Value Date/Time   BILIRUBINUR neg 08/29/2019 0927   PROTEINUR Positive (A) 08/29/2019 0927   UROBILINOGEN 0.2 08/29/2019 0927   NITRITE neg 08/29/2019 0927   LEUKOCYTESUR Small (1+) (A) 08/29/2019 0927    No results found for: LABMICR, [place], RBCUA, LABEPIT, MUCUS, BACTERIA  Pertinent Imaging: CT stone study 3/25: images reviewed and discussed with patient.  No results found for this or any previous visit. No results found for this or any previous visit. No results found for this or any previous visit. No results found for this or any previous visit. No results found for this or any previous visit. No results found for this or any previous visit. No results found for this or any previous visit. No results found for this or any previous visit.  Assessment & Plan:    1. Hematuria, unspecified type -likely related to large right renal calculus - POCT urinalysis dipstick  2. Nephrolithiasis -We discussed the management of kidney stones. These options include observation, ureteroscopy, shockwave lithotripsy (ESWL) and percutaneous nephrolithotomy (PCNL). We discussed which options are relevant to the patient's stone(s). We discussed the natural history of kidney stones as well as the complications of untreated stones and the impact on quality of life without treatment as well as with each of the above  listed treatments. We also discussed the efficacy of each treatment in its ability to clear the stone burden. With any of these management options I discussed the signs and symptoms of infection and the need for emergent treatment should these be experienced. For each option we discussed the ability of each procedure to clear the patient of their stone burden.   For observation I described the risks which include but are not limited to silent renal damage, life-threatening infection, need for emergent surgery, failure to pass stone and pain.   For ureteroscopy I described the risks which include bleeding, infection, damage to contiguous structures, positioning injury, ureteral stricture, ureteral avulsion, ureteral injury, need for prolonged ureteral stent, inability to perform ureteroscopy, need for an interval procedure, inability to clear stone burden, stent discomfort/pain, heart attack, stroke, pulmonary embolus and the inherent risks with general anesthesia.   For shockwave lithotripsy I described the risks which  include arrhythmia, kidney contusion, kidney hemorrhage, need for transfusion, pain, inability to adequately break up stone, inability to pass stone fragments, [place], infection associated with obstructing stones, need for alternate surgical procedure, need for repeat shockwave lithotripsy, MI, CVA, PE and the inherent risks with anesthesia/conscious sedation.   For PCNL I described the risks including positioning injury, pneumothorax, hydrothorax, need for chest tube, inability to clear stone burden, renal laceration, arterial venous fistula or malformation, need for embolization of kidney, loss of kidney or renal function, need for repeat procedure, need for prolonged nephrostomy tube, ureteral avulsion, MI, CVA, PE and the inherent risks of general anesthesia.   - The patient would like to proceed with ESWL  No follow-ups on file.  Nicolette Bang, MD  [facility]  [place]

## 2019-08-29 NOTE — Progress Notes (Signed)
Urological Symptom Review  Patient is experiencing the following symptoms: Frequent urination Burning/pain with urination Get up at night to urinate Blood in urine Kidney Stones  Review of Systems  Gastrointestinal (upper)  : Negative for upper GI symptoms  Gastrointestinal (lower) : Negative for lower GI symptoms  Constitutional : Negative for symptoms  Skin: Negative for skin symptoms  Eyes: Negative for eye symptoms  Ear/Nose/Throat : Negative for Ear/Nose/Throat symptoms  Hematologic/Lymphatic: Negative for Hematologic/Lymphatic symptoms  Cardiovascular : Negative for cardiovascular symptoms  Respiratory : Negative for respiratory symptoms  Endocrine: Negative for endocrine symptoms  Musculoskeletal: Negative for musculoskeletal symptoms  Neurological: Negative for neurological symptoms  Psychologic: Negative for psychiatric symptoms

## 2019-08-30 ENCOUNTER — Other Ambulatory Visit: Payer: Self-pay | Admitting: Urology

## 2019-08-30 DIAGNOSIS — N201 Calculus of ureter: Secondary | ICD-10-CM

## 2019-08-31 ENCOUNTER — Other Ambulatory Visit: Payer: Self-pay

## 2019-08-31 ENCOUNTER — Other Ambulatory Visit (HOSPITAL_COMMUNITY)
Admission: RE | Admit: 2019-08-31 | Discharge: 2019-08-31 | Disposition: A | Discharge status: Home / Self Care | Payer: Medicare Other | Source: Ambulatory Visit | Attending: Urology | Admitting: Urology

## 2019-08-31 DIAGNOSIS — Z01812 Encounter for preprocedural laboratory examination: Secondary | ICD-10-CM | POA: Diagnosis present

## 2019-08-31 DIAGNOSIS — Z20822 Contact with and (suspected) exposure to covid-19: Secondary | ICD-10-CM | POA: Insufficient documentation

## 2019-08-31 NOTE — Progress Notes (Signed)
Talked with patient. Instructions given for litho. Arrival  time [phone]

## 2019-09-01 LAB — SARS CORONAVIRUS 2 (TAT 6-24 HRS): SARS Coronavirus 2: NEGATIVE

## 2019-09-03 ENCOUNTER — Encounter (HOSPITAL_BASED_OUTPATIENT_CLINIC_OR_DEPARTMENT_OTHER): Payer: Self-pay | Admitting: Urology

## 2019-09-03 ENCOUNTER — Other Ambulatory Visit: Payer: Self-pay

## 2019-09-03 ENCOUNTER — Ambulatory Visit (HOSPITAL_BASED_OUTPATIENT_CLINIC_OR_DEPARTMENT_OTHER)
Admission: RE | Admit: 2019-09-03 | Discharge: 2019-09-03 | Disposition: A | Discharge status: Home / Self Care | Payer: Medicare Other | Attending: Urology | Admitting: Urology

## 2019-09-03 ENCOUNTER — Ambulatory Visit (HOSPITAL_COMMUNITY): Payer: Medicare Other

## 2019-09-03 ENCOUNTER — Encounter (HOSPITAL_BASED_OUTPATIENT_CLINIC_OR_DEPARTMENT_OTHER)
Admission: RE | Disposition: A | Discharge status: Home / Self Care | Payer: Self-pay | Source: Home / Self Care | Attending: Urology

## 2019-09-03 DIAGNOSIS — Z20822 Contact with and (suspected) exposure to covid-19: Secondary | ICD-10-CM | POA: Insufficient documentation

## 2019-09-03 DIAGNOSIS — Z79899 Other long term (current) drug therapy: Secondary | ICD-10-CM | POA: Diagnosis not present

## 2019-09-03 DIAGNOSIS — Z96651 Presence of right artificial knee joint: Secondary | ICD-10-CM | POA: Insufficient documentation

## 2019-09-03 DIAGNOSIS — N201 Calculus of ureter: Secondary | ICD-10-CM

## 2019-09-03 DIAGNOSIS — N200 Calculus of kidney: Secondary | ICD-10-CM | POA: Diagnosis not present

## 2019-09-03 HISTORY — DX: Other complications of anesthesia, initial encounter: T88.59XA

## 2019-09-03 HISTORY — PX: EXTRACORPOREAL SHOCK WAVE LITHOTRIPSY: SHX1557

## 2019-09-03 SURGERY — LITHOTRIPSY, ESWL
Anesthesia: LOCAL | Laterality: Left

## 2019-09-03 MED ORDER — DIPHENHYDRAMINE HCL 25 MG PO CAPS
25.0000 mg | ORAL_CAPSULE | ORAL | Status: AC
  Administered 2019-09-03: 25 mg via ORAL
  Filled 2019-09-03: qty 1

## 2019-09-03 MED ORDER — KETOROLAC TROMETHAMINE 60 MG/2ML IM SOLN: 60.0000 mg | Freq: Once | INTRAMUSCULAR | Status: AC

## 2019-09-03 MED ORDER — CIPROFLOXACIN HCL 500 MG PO TABS
500.0000 mg | ORAL_TABLET | ORAL | Status: AC
  Administered 2019-09-03: 500 mg via ORAL
  Filled 2019-09-03: qty 1

## 2019-09-03 MED ORDER — DIPHENHYDRAMINE HCL 25 MG PO CAPS
ORAL_CAPSULE | ORAL | Status: AC
  Filled 2019-09-03: qty 1

## 2019-09-03 MED ORDER — DIAZEPAM 5 MG PO TABS
10.0000 mg | ORAL_TABLET | ORAL | Status: AC
  Administered 2019-09-03: 10 mg via ORAL
  Filled 2019-09-03: qty 2

## 2019-09-03 MED ORDER — OXYCODONE HCL 5 MG PO TABS: 5.0000 mg | ORAL_TABLET | ORAL | 0 refills | Status: DC | PRN

## 2019-09-03 MED ORDER — SODIUM CHLORIDE 0.9 % IV SOLN
INTRAVENOUS | Status: DC
  Filled 2019-09-03: qty 1000

## 2019-09-03 MED ORDER — DIAZEPAM 5 MG PO TABS
ORAL_TABLET | ORAL | Status: AC
  Filled 2019-09-03: qty 2

## 2019-09-03 MED ORDER — CIPROFLOXACIN HCL 500 MG PO TABS
ORAL_TABLET | ORAL | Status: AC
  Filled 2019-09-03: qty 1

## 2019-09-03 NOTE — Discharge Instructions (Signed)
Post Anesthesia Home Care Instructions  Activity: Get plenty of rest for the remainder of the day. A responsible adult should stay with you for 24 hours following the procedure.  For the next 24 hours, DO NOT: -Drive a car -Operate machinery -Drink alcoholic beverages -Take any medication unless instructed by your physician -Make any legal decisions or sign important papers.  Meals: Start with liquid foods such as gelatin or soup. Progress to regular foods as tolerated. Avoid greasy, spicy, heavy foods. If nausea and/or vomiting occur, drink only clear liquids until the nausea and/or vomiting subsides. Call your physician if vomiting continues.  Special Instructions/Symptoms: Your throat may feel dry or sore from the anesthesia or the breathing tube placed in your throat during surgery. If this causes discomfort, gargle with warm salt water. The discomfort should disappear within 24 hours.  If you had a scopolamine patch placed behind your ear for the management of post- operative nausea and/or vomiting:  1. The medication in the patch is effective for 72 hours, after which it should be removed.  Wrap patch in a tissue and discard in the trash. Wash hands thoroughly with soap and water. 2. You may remove the patch earlier than 72 hours if you experience unpleasant side effects which may include dry mouth, dizziness or visual disturbances. 3. Avoid touching the patch. Wash your hands with soap and water after contact with the patch.   Lithotripsy, Care After This sheet gives you information about how to care for yourself after your procedure. Your health care provider may also give you more specific instructions. If you have problems or questions, contact your health care provider. What can I expect after the procedure? After the procedure, it is common to have:  Some blood in your urine. This should only last for a few days.  Soreness in your back, sides, or upper abdomen for a few  days.  Blotches or bruises on your back where the pressure wave entered the skin.  Pain, discomfort, or nausea when pieces (fragments) of the kidney stone move through the tube that carries urine from the kidney to the bladder (ureter). Stone fragments may pass soon after the procedure, but they may continue to pass for up to 4-8 weeks. ? If you have severe pain or nausea, contact your health care provider. This may be caused by a large stone that was not broken up, and this may mean that you need more treatment.  Some pain or discomfort during urination.  Some pain or discomfort in the lower abdomen or (in men) at the base of the penis. Follow these instructions at home: Medicines  Take over-the-counter and prescription medicines only as told by your health care provider.  If you were prescribed an antibiotic medicine, take it as told by your health care provider. Do not stop taking the antibiotic even if you start to feel better.  Do not drive for 24 hours if you were given a medicine to help you relax (sedative).  Do not drive or use heavy machinery while taking prescription pain medicine. Eating and drinking      Drink enough water and fluids to keep your urine clear or pale yellow. This helps any remaining pieces of the stone to pass. It can also help prevent new stones from forming.  Eat plenty of fresh fruits and vegetables.  Follow instructions from your health care provider about eating and drinking restrictions. You may be instructed: ? To reduce how much salt (sodium) you eat   eat or drink. Check ingredients and nutrition facts on packaged foods and beverages. ? To reduce how much meat you eat.  Eat the recommended amount of calcium for your age and gender. Ask your health care provider how much calcium you should have. General instructions  Get plenty of rest.  Most people can resume normal activities 1-2 days after the procedure. Ask your health care provider what  activities are safe for you.  Your health care provider may direct you to lie in a certain position (postural drainage) and tap firmly (percuss) over your kidney area to help stone fragments pass. Follow instructions as told by your health care provider.  If directed, strain all urine through the strainer that was provided by your health care provider. ? Keep all fragments for your health care provider to see. Any stones that are found may be sent to a medical lab for examination. The stone may be as small as a grain of salt.  Keep all follow-up visits as told by your health care provider. This is important. Contact a health care provider if:  You have pain that is severe or does not get better with medicine.  You have nausea that is severe or does not go away.  You have blood in your urine longer than your health care provider told you to expect.  You have more blood in your urine.  You have pain during urination that does not go away.  You urinate more frequently than usual and this does not go away.  You develop a rash or any other possible signs of an allergic reaction. Get help right away if:  You have severe pain in your back, sides, or upper abdomen.  You have severe pain while urinating.  Your urine is very dark red.  You have blood in your stool (feces).  You cannot pass any urine at all.  You feel a strong urge to urinate after emptying your bladder.  You have a fever or chills.  You develop shortness of breath, difficulty breathing, or chest pain.  You have severe nausea that leads to persistent vomiting.  You faint. Summary  After this procedure, it is common to have some pain, discomfort, or nausea when pieces (fragments) of the kidney stone move through the tube that carries urine from the kidney to the bladder (ureter). If this pain or nausea is severe, however, you should contact your health care provider.  Most people can resume normal activities 1-2  days after the procedure. Ask your health care provider what activities are safe for you.  Drink enough water and fluids to keep your urine clear or pale yellow. This helps any remaining pieces of the stone to pass, and it can help prevent new stones from forming.  If directed, strain your urine and keep all fragments for your health care provider to see. Fragments or stones may be as small as a grain of salt.  Get help right away if you have severe pain in your back, sides, or upper abdomen or have severe pain while urinating. This information is not intended to replace advice given to you by your health care provider. Make sure you discuss any questions you have with your health care provider. Document Revised: 08/14/2018 Document Reviewed: 03/24/2016 Elsevier Patient Education  2020 [employer]. See [facility] discharge instructions in chart. 

## 2019-09-03 NOTE — H&P (Signed)
H&P  Chief Complaint: Kidney stone  History of Present Illness: Erica Davis is a 64 y.o. year old female presenting for ESL as primary treatment (probably first part of staged procedure) of a 16 mm left UPJ stone.  Past Medical History:  Diagnosis Date  . Complication of anesthesia    pt has headaches after anesthesia  . Elevated BP 03/19/2013  . Lactose intolerance     Past Surgical History:  Procedure Laterality Date  . [place]  . COLONOSCOPY N/A 12/17/2016   Procedure: COLONOSCOPY;  Surgeon: Danie Binder, MD;  Location: [facility];  Service: Endoscopy;  Laterality: N/A;  8:30am  . TOTAL KNEE ARTHROPLASTY Right 01/01/2015   Procedure: RIGHT TOTAL KNEE REPLACEMENT;  Surgeon: Carole Civil, MD;  Location: [facility];  Service: Orthopedics;  Laterality: Right;    Home Medications:  Medications Prior to Admission  Medication Sig Dispense Refill  . acetaminophen (TYLENOL) 500 MG tablet Take 500-1,000 mg by mouth 2 (two) times daily as needed for moderate pain or headache.    . bismuth subsalicylate (PEPTO BISMOL) 262 MG/15ML suspension Take 30 mLs by mouth every 6 (six) hours as needed for indigestion or diarrhea or loose stools.    . Calcium-Magnesium-Vitamin D (CALCIUM 1200+D3 PO) Take 1 tablet by mouth 2 (two) times daily.    . cetirizine (ZYRTEC) 10 MG tablet Take 10 mg by mouth daily as needed for allergies.    . Coenzyme Q10 (CO Q 10) 100 MG CAPS Take 100 mg by mouth daily.     . Glucos-Chond-Hyal Ac-Ca Fructo (MOVE FREE JOINT HEALTH ADVANCE) TABS Take 2 tablets by mouth daily.     [name] levocetirizine (XYZAL) 5 MG tablet Take 5 mg by mouth at bedtime.    . Multiple Vitamin (MULTIVITAMIN) tablet Take 1 tablet by mouth daily.    . vitamin C (ASCORBIC ACID) 500 MG tablet Take 500 mg by mouth daily.    [name] VITAMIN D PO Take 3,000 mg by mouth.    . Aspirin-Acetaminophen-Caffeine (GOODY HEADACHE PO) Take 1 packet by mouth as needed (headache).     . Magnesium 500 MG  TABS Take 500 mg by mouth daily.    [name] neomycin-bacitracin-polymyxin (NEOSPORIN) ointment Apply 1 application topically as needed for wound care.    . pseudoephedrine-guaifenesin (MUCINEX D) 60-600 MG 12 hr tablet Take 1 tablet by mouth every 12 (twelve) hours as needed for congestion.    . white petrolatum (VASELINE) GEL Apply 1 application topically as needed (sores).      Allergies: No Known Allergies  Family History  Problem Relation Age of Onset  . Macular degeneration Father   . Cancer Maternal Aunt        breast  . Cancer Mother        ovarian    Social History:  reports that she has never smoked. She has never used smokeless tobacco. She reports that she does not drink alcohol or use drugs.  ROS: A complete review of systems was performed.  All systems are negative except for pertinent findings as noted.  Physical Exam:  Vital signs in last 24 hours: Temp:  [98.3 F (36.8 C)] 98.3 F (36.8 C) (04/19 0712) Pulse Rate:  [70] 70 (04/19 0712) Resp:  [18] 18 (04/19 0712) BP: (155)/(86) 155/86 (04/19 0712) SpO2:  [97 %] 97 % (04/19 0712) Weight:  [81.6 kg] 81.6 kg (04/19 0712) General:  Alert and oriented, No acute distress HEENT: Normocephalic, atraumatic Neck:  No JVD or lymphadenopathy Cardiovascular: Regular rate  Lungs: Normal inspiratory/expiratory excursion Abdomen: Soft, nontender, nondistended, no abdominal masses Back: No CVA tenderness Extremities: No edema Neurologic: Grossly intact  Laboratory Data:  No results found for this or any previous visit (from the past 24 hour(s)). Recent Results (from the past 240 hour(s))  SARS CORONAVIRUS 2 (TAT 6-24 HRS) Nasopharyngeal Nasopharyngeal Swab     Status: None   Collection Time: 08/31/19 12:48 PM   Specimen: Nasopharyngeal Swab  Result Value Ref Range Status   SARS Coronavirus 2 NEGATIVE NEGATIVE Final    Comment: (NOTE) SARS-CoV-2 target nucleic acids are NOT DETECTED. The SARS-CoV-2 RNA is generally  detectable in upper and lower respiratory specimens during the acute phase of infection. Negative results do not preclude SARS-CoV-2 infection, do not rule out co-infections with other pathogens, and should not be used as the sole basis for treatment or other patient management decisions. Negative results must be combined with clinical observations, patient history, and epidemiological information. The expected result is Negative. Fact Sheet for Patients: SugarRoll.be Fact Sheet for Healthcare Providers: https://www.woods-mathews.com/ This test is not yet approved or cleared by the [place] FDA and  has been authorized for detection and/or diagnosis of SARS-CoV-2 by FDA under an Emergency Use Authorization (EUA). This EUA will remain  in effect (meaning this test can be used) for the duration of the COVID-19 declaration under Section 56 4(b)(1) of the Act, 21 U.S.C. section 360bbb-3(b)(1), unless the authorization is terminated or revoked sooner. Performed at [facility], [place] [address] [place], [place] [address]    Creatinine: No results for input(s): CREATININE in the last 168 hours.  Radiologic Imaging: No results found.  Impression/Assessment:  Left UPJ stone  Plan:  ESL. The pt is aware thast d/t size as well as location this may well be the 1st part of a staged procedure.  Lillette Boxer Shontavia Mickel 09/03/2019, 7:46 AM  Lillette Boxer. Emersen Mascari MD

## 2019-09-03 NOTE — Interval H&P Note (Signed)
History and Physical Interval Note:  09/03/2019 9:04 AM  Erica Davis [place]  has presented today for surgery, with the diagnosis of LEFT RENAL CALCULUS.  The various methods of treatment have been discussed with the patient and family. After consideration of risks, benefits and other options for treatment, the patient has consented to  Procedure(s): EXTRACORPOREAL SHOCK WAVE LITHOTRIPSY (ESWL) (Left) as a surgical intervention.  The patient's history has been reviewed, patient examined, no change in status, stable for surgery.  I have reviewed the patient's chart and labs.  Questions were answered to the patient's satisfaction.     Lillette Boxer Luretha Eberly

## 2019-09-03 NOTE — Op Note (Signed)
See [facility] OP note scanned into chart. 

## 2019-09-17 ENCOUNTER — Ambulatory Visit: Payer: PRIVATE HEALTH INSURANCE | Admitting: Urology

## 2019-09-19 ENCOUNTER — Ambulatory Visit: Payer: PRIVATE HEALTH INSURANCE | Admitting: Urology

## 2019-09-24 ENCOUNTER — Encounter: Payer: Self-pay | Admitting: Urology

## 2019-09-24 ENCOUNTER — Other Ambulatory Visit (HOSPITAL_COMMUNITY)
Admission: RE | Admit: 2019-09-24 | Discharge: 2019-09-24 | Disposition: A | Discharge status: Home / Self Care | Payer: Medicare Other | Source: Other Acute Inpatient Hospital | Attending: Urology | Admitting: Urology

## 2019-09-24 ENCOUNTER — Ambulatory Visit (INDEPENDENT_AMBULATORY_CARE_PROVIDER_SITE_OTHER): Payer: Medicare Other | Admitting: Urology

## 2019-09-24 ENCOUNTER — Other Ambulatory Visit: Payer: Self-pay

## 2019-09-24 VITALS — BP 159/83 | HR 68 | Temp 98.2°F | Ht 64.5 in | Wt 180.0 lb

## 2019-09-24 DIAGNOSIS — N200 Calculus of kidney: Secondary | ICD-10-CM | POA: Insufficient documentation

## 2019-09-24 NOTE — Progress Notes (Signed)
Urological Symptom Review  Patient is experiencing the following symptoms: none   Review of Systems  Gastrointestinal (upper)  : Negative for upper GI symptoms  Gastrointestinal (lower) : Negative for lower GI symptoms  Constitutional : Negative for symptoms  Skin: Negative for skin symptoms  Eyes: Negative for eye symptoms  Ear/Nose/Throat : Negative for Ear/Nose/Throat symptoms  Hematologic/Lymphatic: Negative for Hematologic/Lymphatic symptoms  Cardiovascular : Negative for cardiovascular symptoms  Respiratory : Negative for respiratory symptoms  Endocrine: Negative for endocrine symptoms  Musculoskeletal: Negative for musculoskeletal symptoms  Neurological: Negative for neurological symptoms  Psychologic: Negative for Psychologic symptoms

## 2019-09-24 NOTE — Progress Notes (Signed)
09/24/2019 3:01 PM   Erica Davis [birth year] [id number]  Referring provider: Celene Squibb, MD [address] [place],  [place] [address]  nephrolithiasis  HPI: Erica Davis is a 64yo here for followup for nephrolothiasis. She underwent ESWL on 4/19. She passed numerous fragments and brought them with her today. She has intermittent left flank pain.    PMH: Past Medical History:  Diagnosis Date  . Complication of anesthesia    pt has headaches after anesthesia  . Elevated BP 03/19/2013  . Lactose intolerance     Surgical History: Past Surgical History:  Procedure Laterality Date  . [place]  . COLONOSCOPY N/A 12/17/2016   Procedure: COLONOSCOPY;  Surgeon: Danie Binder, MD;  [facility];  Service: Endoscopy;  Laterality: N/A;  8:30am  . EXTRACORPOREAL SHOCK WAVE LITHOTRIPSY Left 09/03/2019   Procedure: EXTRACORPOREAL SHOCK WAVE LITHOTRIPSY (ESWL);  Surgeon: Franchot Gallo, MD;  [facility];  Service: Urology;  Laterality: Left;  . TOTAL KNEE ARTHROPLASTY Right 01/01/2015   Procedure: RIGHT TOTAL KNEE REPLACEMENT;  Surgeon: Carole Civil, MD;  Location: [facility];  Service: Orthopedics;  Laterality: Right;    Home Medications:  Allergies as of 09/24/2019   No Known Allergies     Medication List       Accurate as of Sep 24, 2019  3:01 PM. If you have any questions, ask your nurse or doctor.        STOP taking these medications   CALCIUM 1200+D3 PO Stopped by: Nicolette Bang, MD   cetirizine 10 MG tablet Commonly known as: ZYRTEC Stopped by: Nicolette Bang, MD   Co Q 10 100 MG Caps Stopped by: Nicolette Bang, MD   Magnesium 500 MG Tabs Stopped by: Nicolette Bang, MD   Yonah Advance Tabs Stopped by: Nicolette Bang, MD   oxyCODONE 5 MG immediate release tablet Commonly known as: Roxicodone Stopped by: Nicolette Bang, MD   pseudoephedrine-guaifenesin 60-600 MG 12 hr  tablet Commonly known as: MUCINEX D Stopped by: Nicolette Bang, MD     TAKE these medications   acetaminophen 500 MG tablet Commonly known as: TYLENOL Take 500-1,000 mg by mouth 2 (two) times daily as needed for moderate pain or headache.   bismuth subsalicylate [id number] suspension Commonly known as: PEPTO BISMOL Take 30 mLs by mouth every 6 (six) hours as needed for indigestion or diarrhea or loose stools.   GOODY HEADACHE PO Take 1 packet by mouth as needed (headache).   levocetirizine 5 MG tablet Commonly known as: XYZAL Take 5 mg by mouth at bedtime.   multivitamin tablet Take 1 tablet by mouth daily.   neomycin-bacitracin-polymyxin ointment Commonly known as: NEOSPORIN Apply 1 application topically as needed for wound care.   Vaseline Gel Generic drug: white petrolatum Apply 1 application topically as needed (sores).   vitamin C 500 MG tablet Commonly known as: ASCORBIC ACID Take 500 mg by mouth daily.   VITAMIN D PO Take 3,000 mg by mouth.       Allergies: No Known Allergies  Family History: Family History  Problem Relation Age of Onset  . Macular degeneration Father   . Cancer Maternal Aunt        breast  . Cancer Mother        ovarian    Social History:  reports that she has never smoked. She has never used smokeless tobacco. She reports that she does not drink alcohol  or use drugs.  ROS: All other review of systems were reviewed and are negative except what is noted above in HPI  Physical Exam: BP (!) 159/83   Pulse 68   Temp 98.2 F (36.8 C)   Ht 5' 4.5" (1.638 m)   Wt 180 lb (81.6 kg)   BMI 30.42 kg/m   Constitutional:  Alert and oriented, No acute distress. HEENT: [place] AT, moist mucus membranes.  Trachea midline, no masses. Cardiovascular: No clubbing, cyanosis, or edema. Respiratory: Normal respiratory effort, no increased work of breathing. GI: Abdomen is soft, nontender, nondistended, no abdominal masses GU: No CVA tenderness.   Lymph: No cervical or inguinal lymphadenopathy. Skin: No rashes, bruises or suspicious lesions. Neurologic: Grossly intact, no focal deficits, moving all 4 extremities. Psychiatric: Normal mood and affect.  Laboratory Data: Lab Results  Component Value Date   WBC 7.8 01/03/2015   HGB 11.8 (L) 01/03/2015   HCT 36.1 01/03/2015   MCV 95.0 01/03/2015   PLT 208 01/03/2015    Lab Results  Component Value Date   CREATININE 0.60 01/02/2015    No results found for: PSA  No results found for: TESTOSTERONE  No results found for: HGBA1C  Urinalysis    Component Value Date/Time   BILIRUBINUR neg 08/29/2019 0927   PROTEINUR Positive (A) 08/29/2019 0927   UROBILINOGEN 0.2 08/29/2019 0927   NITRITE neg 08/29/2019 0927   LEUKOCYTESUR Small (1+) (A) 08/29/2019 0927    No results found for: LABMICR, [place], RBCUA, LABEPIT, MUCUS, BACTERIA  Pertinent Imaging:  Results for orders placed during the hospital encounter of 09/03/19  DG Abd 1 View   Narrative CLINICAL DATA:  Left renal stone.  EXAM: ABDOMEN - 1 VIEW  COMPARISON:  August 09, 2019.  FINDINGS: The bowel gas pattern is normal. Large left renal pelvic calcification is again noted as described on prior CT scan.  IMPRESSION: Stable large left renal pelvic calcification. No evidence of bowel obstruction or ileus.   Electronically Signed   By: Marijo Conception M.D.   On: 09/03/2019 09:49    No results found for this or any previous visit. No results found for this or any previous visit. No results found for this or any previous visit. No results found for this or any previous visit. No results found for this or any previous visit. No results found for this or any previous visit. No results found for this or any previous visit.  Assessment & Plan:    1. Nephrolithiasis -continue medical expulsive therapy. followup 2 weeks with [facility]   No follow-ups on file.  Nicolette Bang, MD  [facility]  [place]

## 2019-09-24 NOTE — Patient Instructions (Signed)

## 2019-10-02 LAB — CALCULI, WITH PHOTOGRAPH (CLINICAL LAB)
Calcium Oxalate Dihydrate: 40 %
Calcium Oxalate Monohydrate: 60 %
Weight Calculi: 668 mg

## 2019-11-07 ENCOUNTER — Other Ambulatory Visit: Payer: Self-pay

## 2019-11-07 ENCOUNTER — Ambulatory Visit (HOSPITAL_COMMUNITY)
Admission: RE | Admit: 2019-11-07 | Discharge: 2019-11-07 | Disposition: A | Discharge status: Home / Self Care | Payer: Medicare Other | Source: Ambulatory Visit | Attending: Urology | Admitting: Urology

## 2019-11-07 ENCOUNTER — Ambulatory Visit (INDEPENDENT_AMBULATORY_CARE_PROVIDER_SITE_OTHER): Payer: Medicare Other | Admitting: Urology

## 2019-11-07 ENCOUNTER — Encounter: Payer: Self-pay | Admitting: Urology

## 2019-11-07 VITALS — BP 138/81 | HR 87 | Temp 97.6°F | Ht 64.0 in | Wt 180.0 lb

## 2019-11-07 DIAGNOSIS — N200 Calculus of kidney: Secondary | ICD-10-CM | POA: Diagnosis present

## 2019-11-07 NOTE — Patient Instructions (Signed)
Dietary Guidelines to Help Prevent Kidney Stones Kidney stones are deposits of minerals and salts that form inside your kidneys. Your risk of developing kidney stones may be greater depending on your diet, your lifestyle, the medicines you take, and whether you have certain medical conditions. Most people can reduce their chances of developing kidney stones by following the instructions below. Depending on your overall health and the type of kidney stones you tend to develop, your dietitian may give you more specific instructions. What are tips for following this plan? Reading food labels  Choose foods with "no salt added" or "low-salt" labels. Limit your sodium intake to less than 1500 mg per day.  Choose foods with calcium for each meal and snack. Try to eat about 300 mg of calcium at each meal. Foods that contain 200-500 mg of calcium per serving include: ? 8 oz (237 ml) of milk, fortified nondairy milk, and fortified fruit juice. ? 8 oz (237 ml) of kefir, yogurt, and soy yogurt. ? 4 oz (118 ml) of tofu. ? 1 oz of cheese. ? 1 cup (300 g) of dried figs. ? 1 cup (91 g) of cooked broccoli. ? 1-3 oz can of sardines or mackerel.  Most people need 1000 to 1500 mg of calcium each day. Talk to your dietitian about how much calcium is recommended for you. Shopping  Buy plenty of fresh fruits and vegetables. Most people do not need to avoid fruits and vegetables, even if they contain nutrients that may contribute to kidney stones.  When shopping for convenience foods, choose: ? Whole pieces of fruit. ? Premade salads with dressing on the side. ? Low-fat fruit and yogurt smoothies.  Avoid buying frozen meals or prepared deli foods.  Look for foods with live cultures, such as yogurt and kefir. Cooking  Do not add salt to food when cooking. Place a salt shaker on the table and allow each person to add his or her own salt to taste.  Use vegetable protein, such as beans, textured vegetable  protein (TVP), or tofu instead of meat in pasta, casseroles, and soups. Meal planning   Eat less salt, if told by your dietitian. To do this: ? Avoid eating processed or premade food. ? Avoid eating fast food.  Eat less animal protein, including cheese, meat, poultry, or fish, if told by your dietitian. To do this: ? Limit the number of times you have meat, poultry, fish, or cheese each week. Eat a diet free of meat at least 2 days a week. ? Eat only one serving each day of meat, poultry, fish, or seafood. ? When you prepare animal protein, cut pieces into small portion sizes. For most meat and fish, one serving is about the size of one deck of cards.  Eat at least 5 servings of fresh fruits and vegetables each day. To do this: ? Keep fruits and vegetables on hand for snacks. ? Eat 1 piece of fruit or a handful of berries with breakfast. ? Have a salad and fruit at lunch. ? Have two kinds of vegetables at dinner.  Limit foods that are high in a substance called oxalate. These include: ? Spinach. ? Rhubarb. ? Beets. ? Potato chips and Nan fries. ? Nuts.  If you regularly take a diuretic medicine, make sure to eat at least 1-2 fruits or vegetables high in potassium each day. These include: ? Avocado. ? Banana. ? Orange, prune, carrot, or tomato juice. ? Baked potato. ? Cabbage. ? Beans and split   peas. General instructions   Drink enough fluid to keep your urine clear or pale yellow. This is the most important thing you can do.  Talk to your health care provider and dietitian about taking daily supplements. Depending on your health and the cause of your kidney stones, you may be advised: ? Not to take supplements with vitamin C. ? To take a calcium supplement. ? To take a daily probiotic supplement. ? To take other supplements such as magnesium, fish oil, or vitamin B6.  Take all medicines and supplements as told by your health care provider.  Limit alcohol intake to no  more than 1 drink a day for nonpregnant women and 2 drinks a day for men. One drink equals 12 oz of beer, 5 oz of wine, or 1 oz of hard liquor.  Lose weight if told by your health care provider. Work with your dietitian to find strategies and an eating plan that works best for you. What foods are not recommended? Limit your intake of the following foods, or as told by your dietitian. Talk to your dietitian about specific foods you should avoid based on the type of kidney stones and your overall health. Grains Breads. Bagels. Rolls. Baked goods. Salted crackers. Cereal. Pasta. Vegetables Spinach. Rhubarb. Beets. Canned vegetables. Pickles. Olives. Meats and other protein foods Nuts. Nut butters. Large portions of meat, poultry, or fish. Salted or cured meats. Deli meats. Hot dogs. Sausages. Dairy Cheese. Beverages Regular soft drinks. Regular vegetable juice. Seasonings and other foods Seasoning blends with salt. Salad dressings. Canned soups. Soy sauce. Ketchup. Barbecue sauce. Canned pasta sauce. Casseroles. Pizza. Lasagna. Frozen meals. Potato chips. Goldin fries. Summary  You can reduce your risk of kidney stones by making changes to your diet.  The most important thing you can do is drink enough fluid. You should drink enough fluid to keep your urine clear or pale yellow.  Ask your health care provider or dietitian how much protein from animal sources you should eat each day, and also how much salt and calcium you should have each day. This information is not intended to replace advice given to you by your health care provider. Make sure you discuss any questions you have with your health care provider. Document Revised: 08/23/2018 Document Reviewed: 04/13/2016 Elsevier Patient Education  2020 [employer]  

## 2019-11-07 NOTE — Progress Notes (Signed)
11/07/2019 9:30 AM   Eliseo Squires Leeroy Bock 08-May-1956 [id number]  Referring provider: Celene Squibb, MD [address] [place],  [place] [address]  Nephrolithiasis  HPI: [name] is a 64yo here for followup for nephroslithiasis. No stone events since last visit. KUB from this morning shows no left ureteral/renal calculi but she does have a 68mm right renal calculus. NO flank pain. No LUTS   PMH: Past Medical History:  Diagnosis Date  . Complication of anesthesia    pt has headaches after anesthesia  . Elevated BP 03/19/2013  . Lactose intolerance     Surgical History: Past Surgical History:  Procedure Laterality Date  . [place]  . COLONOSCOPY N/A 12/17/2016   Procedure: COLONOSCOPY;  Surgeon: Danie Binder, MD;  [facility];  Service: Endoscopy;  Laterality: N/A;  8:30am  . EXTRACORPOREAL SHOCK WAVE LITHOTRIPSY Left 09/03/2019   Procedure: EXTRACORPOREAL SHOCK WAVE LITHOTRIPSY (ESWL);  Surgeon: Franchot Gallo, MD;  [facility];  Service: Urology;  Laterality: Left;  . TOTAL KNEE ARTHROPLASTY Right 01/01/2015   Procedure: RIGHT TOTAL KNEE REPLACEMENT;  Surgeon: Carole Civil, MD;  Location: [facility];  Service: Orthopedics;  Laterality: Right;    Home Medications:  Allergies as of 11/07/2019   No Known Allergies     Medication List       Accurate as of November 07, 2019  9:30 AM. If you have any questions, ask your nurse or doctor.        acetaminophen 500 MG tablet Commonly known as: TYLENOL Take 500-1,000 mg by mouth 2 (two) times daily as needed for moderate pain or headache.   bismuth subsalicylate 102 VO/53GU suspension Commonly known as: PEPTO BISMOL Take 30 mLs by mouth every 6 (six) hours as needed for indigestion or diarrhea or loose stools.   GOODY HEADACHE PO Take 1 packet by mouth as needed (headache).   levocetirizine 5 MG tablet Commonly known as: XYZAL Take 2.5 mg by mouth at bedtime.     multivitamin tablet Take 1 tablet by mouth daily.   neomycin-bacitracin-polymyxin ointment Commonly known as: NEOSPORIN Apply 1 application topically as needed for wound care.   Vaseline Gel Generic drug: white petrolatum Apply 1 application topically as needed (sores).   vitamin C 500 MG tablet Commonly known as: ASCORBIC ACID Take 500 mg by mouth daily.   VITAMIN D PO Take 3,000 mg by mouth.       Allergies: No Known Allergies  Family History: Family History  Problem Relation Age of Onset  . Macular degeneration Father   . Cancer Maternal Aunt        breast  . Cancer Mother        ovarian    Social History:  reports that she has never smoked. She has never used smokeless tobacco. She reports that she does not drink alcohol and does not use drugs.  ROS: All other review of systems were reviewed and are negative except what is noted above in HPI  Physical Exam: BP 138/81   Pulse 87   Temp 97.6 F (36.4 C)   Ht 5\' 4"  (1.626 m)   Wt 180 lb (81.6 kg)   BMI 30.90 kg/m   Constitutional:  Alert and oriented, No acute distress. HEENT: [place] AT, moist mucus membranes.  Trachea midline, no masses. Cardiovascular: No clubbing, cyanosis, or edema. Respiratory: Normal respiratory effort, no increased work of breathing. GI: Abdomen is soft, nontender, nondistended, no  abdominal masses GU: No CVA tenderness.  Lymph: No cervical or inguinal lymphadenopathy. Skin: No rashes, bruises or suspicious lesions. Neurologic: Grossly intact, no focal deficits, moving all 4 extremities. Psychiatric: Normal mood and affect.  Laboratory Data: Lab Results  Component Value Date   WBC 7.8 01/03/2015   HGB 11.8 (L) 01/03/2015   HCT 36.1 01/03/2015   MCV 95.0 01/03/2015   PLT 208 01/03/2015    Lab Results  Component Value Date   CREATININE 0.60 01/02/2015    No results found for: PSA  No results found for: TESTOSTERONE  No results found for: HGBA1C  Urinalysis     Component Value Date/Time   BILIRUBINUR neg 08/29/2019 0927   PROTEINUR Positive (A) 08/29/2019 0927   UROBILINOGEN 0.2 08/29/2019 0927   NITRITE neg 08/29/2019 0927   LEUKOCYTESUR Small (1+) (A) 08/29/2019 0927    No results found for: LABMICR, [place], RBCUA, LABEPIT, MUCUS, BACTERIA  Pertinent Imaging: KUB from today: images reviewed and discussed with patient Results for orders placed during the hospital encounter of 09/03/19  DG Abd 1 View  Narrative CLINICAL DATA:  Left renal stone.  EXAM: ABDOMEN - 1 VIEW  COMPARISON:  August 09, 2019.  FINDINGS: The bowel gas pattern is normal. Large left renal pelvic calcification is again noted as described on prior CT scan.  IMPRESSION: Stable large left renal pelvic calcification. No evidence of bowel obstruction or ileus.   Electronically Signed By: Marijo Conception M.D. On: 09/03/2019 09:49  No results found for this or any previous visit.  No results found for this or any previous visit.  No results found for this or any previous visit.  No results found for this or any previous visit.  No results found for this or any previous visit.  No results found for this or any previous visit.  No results found for this or any previous visit.   Assessment & Plan:   1. Nephrolithiasis -We discussed the management of kidney stones. These options include observation, ureteroscopy, shockwave lithotripsy (ESWL) and percutaneous nephrolithotomy (PCNL). We discussed which options are relevant to the patient's stone(s). We discussed the natural history of kidney stones as well as the complications of untreated stones and the impact on quality of life without treatment as well as with each of the above listed treatments. We also discussed the efficacy of each treatment in its ability to clear the stone burden. With any of these management options I discussed the signs and symptoms of infection and the need for emergent treatment should  these be experienced. For each option we discussed the ability of each procedure to clear the patient of their stone burden.   For observation I described the risks which include but are not limited to silent renal damage, life-threatening infection, need for emergent surgery, failure to pass stone and pain.   For ureteroscopy I described the risks which include bleeding, infection, damage to contiguous structures, positioning injury, ureteral stricture, ureteral avulsion, ureteral injury, need for prolonged ureteral stent, inability to perform ureteroscopy, need for an interval procedure, inability to clear stone burden, stent discomfort/pain, heart attack, stroke, pulmonary embolus and the inherent risks with general anesthesia.   For shockwave lithotripsy I described the risks which include arrhythmia, kidney contusion, kidney hemorrhage, need for transfusion, pain, inability to adequately break up stone, inability to pass stone fragments, Steinstrasse, infection associated with obstructing stones, need for alternate surgical procedure, need for repeat shockwave lithotripsy, MI, CVA, PE and the inherent risks with  anesthesia/conscious sedation.   For PCNL I described the risks including positioning injury, pneumothorax, hydrothorax, need for chest tube, inability to clear stone burden, renal laceration, arterial venous fistula or malformation, need for embolization of kidney, loss of kidney or renal function, need for repeat procedure, need for prolonged nephrostomy tube, ureteral avulsion, MI, CVA, PE and the inherent risks of general anesthesia.   - The patient would like to proceed with observation   No follow-ups on file.  Nicolette Bang, MD  [facility] [place]

## 2019-11-07 NOTE — Progress Notes (Signed)

## 2020-05-14 ENCOUNTER — Ambulatory Visit: Payer: PRIVATE HEALTH INSURANCE | Admitting: Urology

## 2020-05-14 ENCOUNTER — Other Ambulatory Visit: Payer: Self-pay

## 2020-05-14 ENCOUNTER — Ambulatory Visit (HOSPITAL_COMMUNITY)
Admission: RE | Admit: 2020-05-14 | Discharge: 2020-05-14 | Disposition: A | Discharge status: Home / Self Care | Payer: Medicare Other | Source: Ambulatory Visit | Attending: Urology | Admitting: Urology

## 2020-05-14 DIAGNOSIS — N200 Calculus of kidney: Secondary | ICD-10-CM | POA: Diagnosis present

## 2020-06-18 ENCOUNTER — Other Ambulatory Visit: Payer: Self-pay

## 2020-06-18 ENCOUNTER — Ambulatory Visit (HOSPITAL_COMMUNITY)
Admission: RE | Admit: 2020-06-18 | Discharge: 2020-06-18 | Disposition: A | Discharge status: Home / Self Care | Payer: Medicare Other | Source: Ambulatory Visit | Attending: Urology | Admitting: Urology

## 2020-06-18 ENCOUNTER — Encounter: Payer: Self-pay | Admitting: Urology

## 2020-06-18 ENCOUNTER — Ambulatory Visit (INDEPENDENT_AMBULATORY_CARE_PROVIDER_SITE_OTHER): Payer: Medicare Other | Admitting: Urology

## 2020-06-18 VITALS — BP 168/92 | HR 76 | Temp 98.4°F | Ht 64.0 in | Wt 185.0 lb

## 2020-06-18 DIAGNOSIS — N200 Calculus of kidney: Secondary | ICD-10-CM

## 2020-06-18 LAB — URINALYSIS, ROUTINE W REFLEX MICROSCOPIC
Bilirubin, UA: NEGATIVE
Glucose, UA: NEGATIVE
Ketones, UA: NEGATIVE
Nitrite, UA: POSITIVE — AB
Specific Gravity, UA: 1.01 (ref 1.005–1.030)
Urobilinogen, Ur: 0.2 mg/dL (ref 0.2–1.0)
pH, UA: 5 (ref 5.0–7.5)

## 2020-06-18 LAB — MICROSCOPIC EXAMINATION
Epithelial Cells (non renal): >10 /hpf — AB (ref 0–10)
RBC, Urine: >30 /hpf — AB (ref 0–2)
Renal Epithel, UA: NONE SEEN /hpf

## 2020-06-18 NOTE — Progress Notes (Signed)
06/18/2020 8:48 AM   Erica Davis Erica Davis 1955-08-15 [id number]  Referring provider: Celene Squibb, MD [address] [place],  [place] [address]  Nephrolithiasis  HPI: [name] is [phone] here for followup for nephrolithiasis. She has been having intermittent right flank pain for the past 2 months. KUB from 05/14/2020 showed a right mid ureteral calculus. She is having intermittent sharp, mild nonradiating right flank pain. NO nausea or vomiting. No significant LUTS. No fevers. KUB from today shows persistent right mid ureteral calculus   PMH: Past Medical History:  Diagnosis Date  . Complication of anesthesia    pt has headaches after anesthesia  . Elevated BP 03/19/2013  . Lactose intolerance     Surgical History: Past Surgical History:  Procedure Laterality Date  . [place]  . COLONOSCOPY N/A 12/17/2016   Procedure: COLONOSCOPY;  Surgeon: Danie Binder, MD;  [facility];  Service: Endoscopy;  Laterality: N/A;  8:30am  . EXTRACORPOREAL SHOCK WAVE LITHOTRIPSY Left 09/03/2019   Procedure: EXTRACORPOREAL SHOCK WAVE LITHOTRIPSY (ESWL);  Surgeon: Franchot Gallo, MD;  Location: [facility];  Service: Urology;  Laterality: Left;  . TOTAL KNEE ARTHROPLASTY Right 01/01/2015   Procedure: RIGHT TOTAL KNEE REPLACEMENT;  Surgeon: Carole Civil, MD;  Location: [facility];  Service: Orthopedics;  Laterality: Right;    Home Medications:  Allergies as of 06/18/2020   No Known Allergies     Medication List       Accurate as of June 18, 2020  8:48 AM. If you have any questions, ask your nurse or doctor.        acetaminophen 500 MG tablet Commonly known as: TYLENOL Take 500-1,000 mg by mouth 2 (two) times daily as needed for moderate pain or headache.   bismuth subsalicylate [id number] suspension Commonly known as: PEPTO BISMOL Take 30 mLs by mouth every 6 (six) hours as needed for indigestion or diarrhea or loose stools.   GOODY HEADACHE  PO Take 1 packet by mouth as needed (headache).   levocetirizine 5 MG tablet Commonly known as: XYZAL Take 2.5 mg by mouth at bedtime.   multivitamin tablet Take 1 tablet by mouth daily.   neomycin-bacitracin-polymyxin ointment Commonly known as: NEOSPORIN Apply 1 application topically as needed for wound care.   vitamin C 500 MG tablet Commonly known as: ASCORBIC ACID Take 500 mg by mouth daily.   VITAMIN D PO Take 3,000 mg by mouth.   white petrolatum Gel Commonly known as: VASELINE Apply 1 application topically as needed (sores).       Allergies: No Known Allergies  Family History: Family History  Problem Relation Age of Onset  . Macular degeneration Father   . Cancer Maternal Aunt        breast  . Cancer Mother        ovarian    Social History:  reports that she has never smoked. She has never used smokeless tobacco. She reports that she does not drink alcohol and does not use drugs.  ROS: All other review of systems were reviewed and are negative except what is noted above in HPI  Physical Exam: BP (!) 168/92   Pulse 76   Temp 98.4 F (36.9 C)   Ht 5\' 4"  (1.626 m)   Wt 185 lb (83.9 kg)   BMI 31.76 kg/m   Constitutional:  Alert and oriented, No acute distress. HEENT:  AT, moist mucus membranes.  Trachea midline, no masses. Cardiovascular:  No clubbing, cyanosis, or edema. Respiratory: Normal respiratory effort, no increased work of breathing. GI: Abdomen is soft, nontender, nondistended, no abdominal masses GU: No CVA tenderness.  Lymph: No cervical or inguinal lymphadenopathy. Skin: No rashes, bruises or suspicious lesions. Neurologic: Grossly intact, no focal deficits, moving all 4 extremities. Psychiatric: Normal mood and affect.  Laboratory Data: Lab Results  Component Value Date   WBC 7.8 01/03/2015   HGB 11.8 (L) 01/03/2015   HCT 36.1 01/03/2015   MCV 95.0 01/03/2015   PLT 208 01/03/2015    Lab Results  Component Value Date    CREATININE 0.60 01/02/2015    No results found for: PSA  No results found for: TESTOSTERONE  No results found for: HGBA1C  Urinalysis    Component Value Date/Time   BILIRUBINUR neg 08/29/2019 0927   PROTEINUR Positive (A) 08/29/2019 0927   UROBILINOGEN 0.2 08/29/2019 0927   NITRITE neg 08/29/2019 0927   LEUKOCYTESUR Small (1+) (A) 08/29/2019 0927    No results found for: LABMICR, [place], RBCUA, LABEPIT, MUCUS, BACTERIA  Pertinent Imaging: KUB today: Images reviewed and discussed with the patient Results for orders placed during the hospital encounter of 05/14/20  Abdomen 1 view (KUB)  Narrative CLINICAL DATA:  65 year old female with nephrolithiasis  EXAM: ABDOMEN - 1 VIEW  COMPARISON:  11/07/2019  FINDINGS: Gas within stomach small bowel and colon.  No abnormal distention.  No air-fluid levels.  No calcifications projecting in the region the left renal silhouette.  No calcifications in the right abdomen. The prior geometric calcific density in the right abdomen is not visualized, although the region obscured by bowel gas.  Partially calcified fibroid within the end time pelvis.  Geometric density in the right anatomic pelvis, new from the comparison. Is similar size to the prior calcification in the right abdomen.  No displaced fracture.  Degenerative changes of the spine.  IMPRESSION: New geometric calcification in the right anatomic pelvis, approximately 6 mm, appears to correlate to the prior right-sided renal calculus, and would represent new distal right ureteral stone. CT may be considered for confirmation, and to evaluate for any possible hydronephrosis.  No evidence of left renal calcifications.  Fibroid uterus.   Electronically Signed By: Corrie Mckusick D.O. On: 05/14/2020 12:01  No results found for this or any previous visit.  No results found for this or any previous visit.  No results found for this or any previous visit.  No  results found for this or any previous visit.  No results found for this or any previous visit.  No results found for this or any previous visit.  No results found for this or any previous visit.   Assessment & Plan:    1. Nephrolithiasis -We discussed the management of kidney stones. These options include observation, ureteroscopy, shockwave lithotripsy (ESWL) and percutaneous nephrolithotomy (PCNL). We discussed which options are relevant to the patient's stone(s). We discussed the natural history of kidney stones as well as the complications of untreated stones and the impact on quality of life without treatment as well as with each of the above listed treatments. We also discussed the efficacy of each treatment in its ability to clear the stone burden. With any of these management options I discussed the signs and symptoms of infection and the need for emergent treatment should these be experienced. For each option we discussed the ability of each procedure to clear the patient of their stone burden.   For observation I described the risks which include  but are not limited to silent renal damage, life-threatening infection, need for emergent surgery, failure to pass stone and pain.   For ureteroscopy I described the risks which include bleeding, infection, damage to contiguous structures, positioning injury, ureteral stricture, ureteral avulsion, ureteral injury, need for prolonged ureteral stent, inability to perform ureteroscopy, need for an interval procedure, inability to clear stone burden, stent discomfort/pain, heart attack, stroke, pulmonary embolus and the inherent risks with general anesthesia.   For shockwave lithotripsy I described the risks which include arrhythmia, kidney contusion, kidney hemorrhage, need for transfusion, pain, inability to adequately break up stone, inability to pass stone fragments, Steinstrasse, infection associated with obstructing stones, need for alternate  surgical procedure, need for repeat shockwave lithotripsy, MI, CVA, PE and the inherent risks with anesthesia/conscious sedation.   For PCNL I described the risks including positioning injury, pneumothorax, hydrothorax, need for chest tube, inability to clear stone burden, renal laceration, arterial venous fistula or malformation, need for embolization of kidney, loss of kidney or renal function, need for repeat procedure, need for prolonged nephrostomy tube, ureteral avulsion, MI, CVA, PE and the inherent risks of general anesthesia.   - The patient would like to proceed with Right ESWL  - Urinalysis, Routine w reflex microscopic   No follow-ups on file.  Nicolette Bang, MD  [facility] [place]

## 2020-06-18 NOTE — Progress Notes (Signed)
Urological Symptom Review ° °Patient is experiencing the following symptoms: °Frequent urination °Get up at night to urinate ° ° °Review of Systems ° °Gastrointestinal (upper)  : °Negative for upper GI symptoms ° °Gastrointestinal (lower) : °Negative for lower GI symptoms ° °Constitutional : °Negative for symptoms ° °Skin: °Negative for skin symptoms ° °Eyes: °Negative for eye symptoms ° °Ear/Nose/Throat : °Sinus problems ° °Hematologic/Lymphatic: °Negative for Hematologic/Lymphatic symptoms ° °Cardiovascular : °Negative for cardiovascular symptoms ° °Respiratory : °Negative for respiratory symptoms ° °Endocrine: °Negative for endocrine symptoms ° °Musculoskeletal: °Negative for musculoskeletal symptoms ° °Neurological: °Negative for neurological symptoms ° °Psychologic: °Negative for psychiatric symptoms °

## 2020-06-18 NOTE — Patient Instructions (Signed)
[employer] (25th ed., pp. 811-816). [place], [place]: [employer], Else[employer]. Retrieved from https://www.clinicalkey.com/#!/content/book/3-[email or web address]-[email or web address]?[email or web address]">  Lithotripsy  Lithotripsy is a treatment that can help break up kidney stones that are too large to pass on their own. This is a nonsurgical procedure that crushes a kidney stone with shock waves. These shock waves pass through your body and focus on the kidney stone. They cause the kidney stone to break up into smaller pieces while it is still in the urinary tract. The smaller pieces of stone can pass more easily out of your body in the urine. Tell a health care provider about:  Any allergies you have.  All medicines you are taking, including vitamins, herbs, eye drops, creams, and over-the-counter medicines.  Any problems you or family members have had with anesthetic medicines.  Any blood disorders you have.  Any surgeries you have had.  Any medical conditions you have.  Whether you are pregnant or may be pregnant. What are the risks? Generally, this is a safe procedure. However, problems may occur, including:  Infection.  Bleeding from the kidney.  Bruising of the kidney or skin.  Scarring of the kidney, which can lead to: ? Increased blood pressure. ? Poor kidney function. ? Return (recurrence) of kidney stones.  Damage to other structures or organs, such as the liver, colon, spleen, or pancreas.  Blockage (obstruction) of the tube that carries urine from the kidney to the bladder (ureter).  Failure of the kidney stone to break into pieces (fragments). What happens before the procedure? Staying hydrated Follow instructions from your health care provider about hydration, which may include:  Up to 2 hours before the procedure - you may continue to drink clear liquids, such as water, clear fruit juice, black coffee, and plain tea. Eating and drinking restrictions Follow  instructions from your health care provider about eating and drinking, which may include:  8 hours before the procedure - stop eating heavy meals or foods, such as meat, fried foods, or fatty foods.  6 hours before the procedure - stop eating light meals or foods, such as toast or cereal.  6 hours before the procedure - stop drinking milk or drinks that contain milk.  2 hours before the procedure - stop drinking clear liquids. Medicines Ask your health care provider about:  Changing or stopping your regular medicines. This is especially important if you are taking diabetes medicines or blood thinners.  Taking medicines such as aspirin and ibuprofen. These medicines can thin your blood. Do not take these medicines unless your health care provider tells you to take them.  Taking over-the-counter medicines, vitamins, herbs, and supplements. Tests You may have tests, such as:  Blood tests.  Urine tests.  Imaging tests, such as a CT scan. General instructions  Plan to have someone take you home from the hospital or clinic.  If you will be going home right after the procedure, plan to have someone with you for 24 hours.  Ask your health care provider what steps will be taken to help prevent infection. These may include washing skin with a germ-killing soap. What happens during the procedure?  An IV will be inserted into one of your veins.  You will be given one or more of the following: ? A medicine to help you relax (sedative). ? A medicine to make you fall asleep (general anesthetic).  A water-filled cushion may be placed behind your kidney or on your abdomen. In some cases, you may be placed in a tub of   lukewarm water.  Your body will be positioned in a way that makes it easy to target the kidney stone.  An X-ray or ultrasound exam will be done to locate your stone.  Shock waves will be aimed at the stone. If you are awake, you may feel a tapping sensation as the shock  waves pass through your body.  A flexible tube with holes in it (stent) may be placed in the ureter. This will help keep urine flowing from the kidney if the fragments of the stone have been blocking the ureter. The procedure may vary among health care providers and hospitals.   What happens after the procedure?  You may have an X-ray to see whether the procedure was able to break up the kidney stone and how much of the stone has passed. If large stone fragments remain after treatment, you may need to have a second procedure at a later time.  Your blood pressure, heart rate, breathing rate, and blood oxygen level will be monitored until you leave the hospital or clinic.  You may be given antibiotics or pain medicine as needed.  If a stent was placed in your ureter during surgery, it may stay in place for a few weeks.  You may need to strain your urine to collect pieces of the kidney stone for testing.  You will need to drink plenty of water.  If you were given a sedative during the procedure, it can affect you for several hours. Do not drive or operate machinery until your health care provider says that it is safe. Summary  Lithotripsy is a treatment that can help break up kidney stones that are too large to pass on their own.  Lithotripsy is a nonsurgical procedure that crushes a kidney stone with shock waves.  Generally, this is a safe procedure. However, problems may occur, including damage to the kidney or other organs, infection, or obstruction of the tube that carries urine from the kidney to the bladder (ureter).  You may have a stent placed in your ureter to help drain your urine. This stent may stay in place for a few weeks.  After the procedure, you will need to drink plenty of water. You may be asked to strain your urine to collect pieces of the kidney stone for testing. This information is not intended to replace advice given to you by your health care provider. Make sure  you discuss any questions you have with your health care provider. Document Revised: 02/14/2019 Document Reviewed: 02/14/2019 Elsevier Patient Education  2021 [employer].  

## 2020-06-18 NOTE — H&P (View-Only) (Signed)
06/18/2020 8:48 AM   Eliseo Squires Leeroy Bock [birth year] [id number]  Referring provider: Celene Squibb, MD [address] [place],  [place] [address]  Nephrolithiasis  HPI: [name] is [phone] here for followup for nephrolithiasis. She has been having intermittent right flank pain for the past 2 months. KUB from 05/14/2020 showed a right mid ureteral calculus. She is having intermittent sharp, mild nonradiating right flank pain. NO nausea or vomiting. No significant LUTS. No fevers. KUB from today shows persistent right mid ureteral calculus   PMH: Past Medical History:  Diagnosis Date  . Complication of anesthesia    pt has headaches after anesthesia  . Elevated BP 03/19/2013  . Lactose intolerance     Surgical History: Past Surgical History:  Procedure Laterality Date  . [place]  . COLONOSCOPY N/A 12/17/2016   Procedure: COLONOSCOPY;  Surgeon: Danie Binder, MD;  [facility];  Service: Endoscopy;  Laterality: N/A;  8:30am  . EXTRACORPOREAL SHOCK WAVE LITHOTRIPSY Left 09/03/2019   Procedure: EXTRACORPOREAL SHOCK WAVE LITHOTRIPSY (ESWL);  Surgeon: Franchot Gallo, MD;  Location: [facility];  Service: Urology;  Laterality: Left;  . TOTAL KNEE ARTHROPLASTY Right 01/01/2015   Procedure: RIGHT TOTAL KNEE REPLACEMENT;  Surgeon: Carole Civil, MD;  Location: [facility];  Service: Orthopedics;  Laterality: Right;    Home Medications:  Allergies as of 06/18/2020   No Known Allergies     Medication List       Accurate as of June 18, 2020  8:48 AM. If you have any questions, ask your nurse or doctor.        acetaminophen 500 MG tablet Commonly known as: TYLENOL Take 500-1,000 mg by mouth 2 (two) times daily as needed for moderate pain or headache.   bismuth subsalicylate [id number] suspension Commonly known as: PEPTO BISMOL Take 30 mLs by mouth every 6 (six) hours as needed for indigestion or diarrhea or loose stools.   GOODY HEADACHE  PO Take 1 packet by mouth as needed (headache).   levocetirizine 5 MG tablet Commonly known as: XYZAL Take 2.5 mg by mouth at bedtime.   multivitamin tablet Take 1 tablet by mouth daily.   neomycin-bacitracin-polymyxin ointment Commonly known as: NEOSPORIN Apply 1 application topically as needed for wound care.   vitamin C 500 MG tablet Commonly known as: ASCORBIC ACID Take 500 mg by mouth daily.   VITAMIN D PO Take 3,000 mg by mouth.   white petrolatum Gel Commonly known as: VASELINE Apply 1 application topically as needed (sores).       Allergies: No Known Allergies  Family History: Family History  Problem Relation Age of Onset  . Macular degeneration Father   . Cancer Maternal Aunt        breast  . Cancer Mother        ovarian    Social History:  reports that she has never smoked. She has never used smokeless tobacco. She reports that she does not drink alcohol and does not use drugs.  ROS: All other review of systems were reviewed and are negative except what is noted above in HPI  Physical Exam: BP (!) 168/92   Pulse 76   Temp 98.4 F (36.9 C)   Ht 5\' 4"  (1.626 m)   Wt 185 lb (83.9 kg)   BMI 31.76 kg/m   Constitutional:  Alert and oriented, No acute distress. HEENT: [place] AT, moist mucus membranes.  Trachea midline, no masses. Cardiovascular:  No clubbing, cyanosis, or edema. Respiratory: Normal respiratory effort, no increased work of breathing. GI: Abdomen is soft, nontender, nondistended, no abdominal masses GU: No CVA tenderness.  Lymph: No cervical or inguinal lymphadenopathy. Skin: No rashes, bruises or suspicious lesions. Neurologic: Grossly intact, no focal deficits, moving all 4 extremities. Psychiatric: Normal mood and affect.  Laboratory Data: Lab Results  Component Value Date   WBC 7.8 01/03/2015   HGB 11.8 (L) 01/03/2015   HCT 36.1 01/03/2015   MCV 95.0 01/03/2015   PLT 208 01/03/2015    Lab Results  Component Value Date    CREATININE 0.60 01/02/2015    No results found for: PSA  No results found for: TESTOSTERONE  No results found for: HGBA1C  Urinalysis    Component Value Date/Time   BILIRUBINUR neg 08/29/2019 0927   PROTEINUR Positive (A) 08/29/2019 0927   UROBILINOGEN 0.2 08/29/2019 0927   NITRITE neg 08/29/2019 0927   LEUKOCYTESUR Small (1+) (A) 08/29/2019 0927    No results found for: LABMICR, [place], RBCUA, LABEPIT, MUCUS, BACTERIA  Pertinent Imaging: KUB today: Images reviewed and discussed with the patient Results for orders placed during the hospital encounter of 05/14/20  Abdomen 1 view (KUB)  Narrative CLINICAL DATA:  65 year old female with nephrolithiasis  EXAM: ABDOMEN - 1 VIEW  COMPARISON:  11/07/2019  FINDINGS: Gas within stomach small bowel and colon.  No abnormal distention.  No air-fluid levels.  No calcifications projecting in the region the left renal silhouette.  No calcifications in the right abdomen. The prior geometric calcific density in the right abdomen is not visualized, although the region obscured by bowel gas.  Partially calcified fibroid within the end time pelvis.  Geometric density in the right anatomic pelvis, new from the comparison. Is similar size to the prior calcification in the right abdomen.  No displaced fracture.  Degenerative changes of the spine.  IMPRESSION: New geometric calcification in the right anatomic pelvis, approximately 6 mm, appears to correlate to the prior right-sided renal calculus, and would represent new distal right ureteral stone. CT may be considered for confirmation, and to evaluate for any possible hydronephrosis.  No evidence of left renal calcifications.  Fibroid uterus.   Electronically Signed By: Corrie Mckusick D.O. On: 05/14/2020 12:01  No results found for this or any previous visit.  No results found for this or any previous visit.  No results found for this or any previous visit.  No  results found for this or any previous visit.  No results found for this or any previous visit.  No results found for this or any previous visit.  No results found for this or any previous visit.   Assessment & Plan:    1. Nephrolithiasis -We discussed the management of kidney stones. These options include observation, ureteroscopy, shockwave lithotripsy (ESWL) and percutaneous nephrolithotomy (PCNL). We discussed which options are relevant to the patient's stone(s). We discussed the natural history of kidney stones as well as the complications of untreated stones and the impact on quality of life without treatment as well as with each of the above listed treatments. We also discussed the efficacy of each treatment in its ability to clear the stone burden. With any of these management options I discussed the signs and symptoms of infection and the need for emergent treatment should these be experienced. For each option we discussed the ability of each procedure to clear the patient of their stone burden.   For observation I described the risks which include  but are not limited to silent renal damage, life-threatening infection, need for emergent surgery, failure to pass stone and pain.   For ureteroscopy I described the risks which include bleeding, infection, damage to contiguous structures, positioning injury, ureteral stricture, ureteral avulsion, ureteral injury, need for prolonged ureteral stent, inability to perform ureteroscopy, need for an interval procedure, inability to clear stone burden, stent discomfort/pain, heart attack, stroke, pulmonary embolus and the inherent risks with general anesthesia.   For shockwave lithotripsy I described the risks which include arrhythmia, kidney contusion, kidney hemorrhage, need for transfusion, pain, inability to adequately break up stone, inability to pass stone fragments, [place], infection associated with obstructing stones, need for alternate  surgical procedure, need for repeat shockwave lithotripsy, MI, CVA, PE and the inherent risks with anesthesia/conscious sedation.   For PCNL I described the risks including positioning injury, pneumothorax, hydrothorax, need for chest tube, inability to clear stone burden, renal laceration, arterial venous fistula or malformation, need for embolization of kidney, loss of kidney or renal function, need for repeat procedure, need for prolonged nephrostomy tube, ureteral avulsion, MI, CVA, PE and the inherent risks of general anesthesia.   - The patient would like to proceed with Right ESWL  - Urinalysis, Routine w reflex microscopic   No follow-ups on file.  Nicolette Bang, MD  [facility] [place]

## 2020-06-19 ENCOUNTER — Encounter (HOSPITAL_BASED_OUTPATIENT_CLINIC_OR_DEPARTMENT_OTHER): Payer: Self-pay | Admitting: Urology

## 2020-06-19 NOTE — Progress Notes (Signed)
Patient to arrive at 0600 on 06/24/20. History and medications reviewed. Pre-procedure instructions given. NPO after MN Monday except for clear liquids until 0330. Driver secured.

## 2020-06-20 ENCOUNTER — Inpatient Hospital Stay (HOSPITAL_COMMUNITY): Admission: RE | Admit: 2020-06-20 | Payer: PRIVATE HEALTH INSURANCE | Source: Ambulatory Visit

## 2020-06-23 ENCOUNTER — Other Ambulatory Visit: Payer: Self-pay

## 2020-06-23 ENCOUNTER — Other Ambulatory Visit (HOSPITAL_COMMUNITY)
Admission: RE | Admit: 2020-06-23 | Discharge: 2020-06-23 | Disposition: A | Discharge status: Home / Self Care | Payer: Medicare Other | Source: Ambulatory Visit | Attending: Urology | Admitting: Urology

## 2020-06-23 ENCOUNTER — Other Ambulatory Visit (HOSPITAL_COMMUNITY): Payer: PRIVATE HEALTH INSURANCE

## 2020-06-23 DIAGNOSIS — Z20822 Contact with and (suspected) exposure to covid-19: Secondary | ICD-10-CM | POA: Insufficient documentation

## 2020-06-23 DIAGNOSIS — Z01812 Encounter for preprocedural laboratory examination: Secondary | ICD-10-CM | POA: Diagnosis present

## 2020-06-23 LAB — SARS CORONAVIRUS 2 (TAT 6-24 HRS): SARS Coronavirus 2: NEGATIVE

## 2020-06-24 ENCOUNTER — Encounter (HOSPITAL_BASED_OUTPATIENT_CLINIC_OR_DEPARTMENT_OTHER)
Admission: RE | Disposition: A | Discharge status: Home / Self Care | Payer: Self-pay | Source: Home / Self Care | Attending: Urology

## 2020-06-24 ENCOUNTER — Ambulatory Visit (HOSPITAL_COMMUNITY): Payer: Medicare Other

## 2020-06-24 ENCOUNTER — Other Ambulatory Visit: Payer: Self-pay

## 2020-06-24 ENCOUNTER — Ambulatory Visit (HOSPITAL_COMMUNITY)
Admission: RE | Admit: 2020-06-24 | Discharge: 2020-06-24 | Disposition: A | Discharge status: Home / Self Care | Payer: Medicare Other | Attending: Urology | Admitting: Urology

## 2020-06-24 DIAGNOSIS — Z6832 Body mass index (BMI) 32.0-32.9, adult: Secondary | ICD-10-CM | POA: Diagnosis not present

## 2020-06-24 DIAGNOSIS — N201 Calculus of ureter: Secondary | ICD-10-CM | POA: Diagnosis present

## 2020-06-24 DIAGNOSIS — N200 Calculus of kidney: Secondary | ICD-10-CM

## 2020-06-24 DIAGNOSIS — E669 Obesity, unspecified: Secondary | ICD-10-CM | POA: Diagnosis not present

## 2020-06-24 DIAGNOSIS — Z96651 Presence of right artificial knee joint: Secondary | ICD-10-CM | POA: Diagnosis not present

## 2020-06-24 HISTORY — DX: Personal history of urinary calculi: Z87.442

## 2020-06-24 HISTORY — PX: EXTRACORPOREAL SHOCK WAVE LITHOTRIPSY: SHX1557

## 2020-06-24 SURGERY — LITHOTRIPSY, ESWL
Anesthesia: LOCAL | Laterality: Right

## 2020-06-24 MED ORDER — SODIUM CHLORIDE 0.9 % IV SOLN
INTRAVENOUS | Status: DC
  Administered 2020-06-24: 1000 mL via INTRAVENOUS

## 2020-06-24 MED ORDER — DIAZEPAM 5 MG PO TABS
10.0000 mg | ORAL_TABLET | ORAL | Status: AC
  Administered 2020-06-24: 10 mg via ORAL

## 2020-06-24 MED ORDER — DIPHENHYDRAMINE HCL 25 MG PO CAPS
ORAL_CAPSULE | ORAL | Status: AC
  Filled 2020-06-24: qty 1

## 2020-06-24 MED ORDER — DIAZEPAM 5 MG PO TABS
ORAL_TABLET | ORAL | Status: AC
  Filled 2020-06-24: qty 2

## 2020-06-24 MED ORDER — TAMSULOSIN HCL 0.4 MG PO CAPS: 0.4000 mg | ORAL_CAPSULE | Freq: Every day | ORAL | 0 refills | Status: DC

## 2020-06-24 MED ORDER — OXYCODONE-ACETAMINOPHEN 5-325 MG PO TABS: 1.0000 | ORAL_TABLET | ORAL | 0 refills | Status: DC | PRN

## 2020-06-24 MED ORDER — ONDANSETRON HCL 4 MG PO TABS: 4.0000 mg | ORAL_TABLET | Freq: Every day | ORAL | 1 refills | Status: DC | PRN

## 2020-06-24 MED ORDER — DIPHENHYDRAMINE HCL 25 MG PO CAPS
25.0000 mg | ORAL_CAPSULE | ORAL | Status: AC
  Administered 2020-06-24: 25 mg via ORAL

## 2020-06-24 NOTE — Interval H&P Note (Signed)
History and Physical Interval Note:  06/24/2020 8:58 AM  Erica Davis  has presented today for surgery, with the diagnosis of right ureteral calculus.  The various methods of treatment have been discussed with the patient and family. After consideration of risks, benefits and other options for treatment, the patient has consented to  Procedure(s): EXTRACORPOREAL SHOCK WAVE LITHOTRIPSY (ESWL) (Right) as a surgical intervention.  The patient's history has been reviewed, patient examined, no change in status, stable for surgery.  I have reviewed the patient's chart and labs.  Questions were answered to the patient's satisfaction.     Nicolette Bang

## 2020-06-24 NOTE — Discharge Instructions (Signed)

## 2020-06-25 ENCOUNTER — Encounter (HOSPITAL_BASED_OUTPATIENT_CLINIC_OR_DEPARTMENT_OTHER): Payer: Self-pay | Admitting: Urology

## 2020-07-09 ENCOUNTER — Ambulatory Visit: Payer: PRIVATE HEALTH INSURANCE | Admitting: Urology

## 2020-07-11 ENCOUNTER — Encounter: Payer: Self-pay | Admitting: Urology

## 2020-07-11 ENCOUNTER — Other Ambulatory Visit: Payer: Self-pay

## 2020-07-11 ENCOUNTER — Ambulatory Visit (HOSPITAL_COMMUNITY)
Admission: RE | Admit: 2020-07-11 | Discharge: 2020-07-11 | Disposition: A | Discharge status: Home / Self Care | Payer: Medicare Other | Source: Ambulatory Visit | Attending: Urology | Admitting: Urology

## 2020-07-11 ENCOUNTER — Ambulatory Visit (INDEPENDENT_AMBULATORY_CARE_PROVIDER_SITE_OTHER): Payer: Medicare Other | Admitting: Urology

## 2020-07-11 VITALS — BP 169/84 | HR 82 | Temp 98.7°F | Ht 64.0 in | Wt 187.0 lb

## 2020-07-11 DIAGNOSIS — N200 Calculus of kidney: Secondary | ICD-10-CM | POA: Diagnosis present

## 2020-07-11 LAB — URINALYSIS, ROUTINE W REFLEX MICROSCOPIC
Bilirubin, UA: NEGATIVE
Glucose, UA: NEGATIVE
Ketones, UA: NEGATIVE
Nitrite, UA: NEGATIVE
Protein,UA: NEGATIVE
Specific Gravity, UA: 1.015 (ref 1.005–1.030)
Urobilinogen, Ur: 0.2 mg/dL (ref 0.2–1.0)
pH, UA: 5 (ref 5.0–7.5)

## 2020-07-11 LAB — MICROSCOPIC EXAMINATION
Epithelial Cells (non renal): >10 /hpf — AB (ref 0–10)
Renal Epithel, UA: NONE SEEN /hpf

## 2020-07-11 NOTE — Addendum Note (Signed)
Addended byIris Pert on: 07/11/2020 03:33 PM   Modules accepted: Orders

## 2020-07-11 NOTE — Progress Notes (Signed)
07/11/2020 2:19 PM   Eliseo Squires Leeroy Bock 09-Nov-1955 [id number]  Referring provider: Celene Squibb, MD [address] [place],  [place] [address]  followup after ESWL  HPI: Ms Johns is a 65yo here for followup for right ureteral calculus. KUB shows no calculi. She brought numerous fragments with her today. No flank pain. No LUTS.    PMH: Past Medical History:  Diagnosis Date  . Complication of anesthesia    pt has headaches after anesthesia  . Elevated BP 03/19/2013  . History of kidney stones   . Lactose intolerance     Surgical History: Past Surgical History:  Procedure Laterality Date  . [place]  . COLONOSCOPY N/A 12/17/2016   Procedure: COLONOSCOPY;  Surgeon: Danie Binder, MD;  [facility];  Service: Endoscopy;  Laterality: N/A;  8:30am  . EXTRACORPOREAL SHOCK WAVE LITHOTRIPSY Left 09/03/2019   Procedure: EXTRACORPOREAL SHOCK WAVE LITHOTRIPSY (ESWL);  Surgeon: Franchot Gallo, MD;  Location: [facility];  Service: Urology;  Laterality: Left;  . EXTRACORPOREAL SHOCK WAVE LITHOTRIPSY Right 06/24/2020   Procedure: EXTRACORPOREAL SHOCK WAVE LITHOTRIPSY (ESWL);  Surgeon: Cleon Gustin, MD;  Location: [facility];  Service: Urology;  Laterality: Right;  . TOTAL KNEE ARTHROPLASTY Right 01/01/2015   Procedure: RIGHT TOTAL KNEE REPLACEMENT;  Surgeon: Carole Civil, MD;  Location: [facility];  Service: Orthopedics;  Laterality: Right;    Home Medications:  Allergies as of 07/11/2020   No Known Allergies     Medication List       Accurate as of July 11, 2020  2:19 PM. If you have any questions, ask your nurse or doctor.        acetaminophen 500 MG tablet Commonly known as: TYLENOL Take 500-1,000 mg by mouth 2 (two) times daily as needed for moderate pain or headache.   amoxicillin-clavulanate 875-125 MG tablet Commonly known as: AUGMENTIN Take 1 tablet by mouth 2 (two) times daily.   bismuth  subsalicylate 865 HQ/46NG suspension Commonly known as: PEPTO BISMOL Take 30 mLs by mouth every 6 (six) hours as needed for indigestion or diarrhea or loose stools.   co-enzyme Q-10 30 MG capsule Take 100 mg by mouth every other day.   GOODY HEADACHE PO Take 1 packet by mouth as needed (headache).   levocetirizine 5 MG tablet Commonly known as: XYZAL Take 2.5 mg by mouth at bedtime.   multivitamin tablet Take 1 tablet by mouth daily.   neomycin-bacitracin-polymyxin ointment Commonly known as: NEOSPORIN Apply 1 application topically as needed for wound care.   ondansetron 4 MG tablet Commonly known as: Zofran Take 1 tablet (4 mg total) by mouth daily as needed for nausea or vomiting.   oxyCODONE-acetaminophen 5-325 MG tablet Commonly known as: Percocet Take 1 tablet by mouth every 4 (four) hours as needed for severe pain.   tamsulosin 0.4 MG Caps capsule Commonly known as: Flomax Take 1 capsule (0.4 mg total) by mouth daily after supper.   VITAMIN D PO Take 3,000 mg by mouth.   white petrolatum Gel Commonly known as: VASELINE Apply 1 application topically as needed (sores).       Allergies: No Known Allergies  Family History: Family History  Problem Relation Age of Onset  . Macular degeneration Father   . Cancer Maternal Aunt        breast  . Cancer Mother        ovarian    Social History:  reports that  she has never smoked. She has never used smokeless tobacco. She reports that she does not drink alcohol and does not use drugs.  ROS: All other review of systems were reviewed and are negative except what is noted above in HPI  Physical Exam: BP (!) 169/84   Pulse 82   Temp 98.7 F (37.1 C)   Ht 5\' 4"  (1.626 m)   Wt 187 lb (84.8 kg)   BMI 32.10 kg/m   Constitutional:  Alert and oriented, No acute distress. HEENT: [place] AT, moist mucus membranes.  Trachea midline, no masses. Cardiovascular: No clubbing, cyanosis, or edema. Respiratory: Normal  respiratory effort, no increased work of breathing. GI: Abdomen is soft, nontender, nondistended, no abdominal masses GU: No CVA tenderness.  Lymph: No cervical or inguinal lymphadenopathy. Skin: No rashes, bruises or suspicious lesions. Neurologic: Grossly intact, no focal deficits, moving all 4 extremities. Psychiatric: Normal mood and affect.  Laboratory Data: Lab Results  Component Value Date   WBC 7.8 01/03/2015   HGB 11.8 (L) 01/03/2015   HCT 36.1 01/03/2015   MCV 95.0 01/03/2015   PLT 208 01/03/2015    Lab Results  Component Value Date   CREATININE 0.60 01/02/2015    No results found for: PSA  No results found for: TESTOSTERONE  No results found for: HGBA1C  Urinalysis    Component Value Date/Time   APPEARANCEUR Clear 06/18/2020 0839   GLUCOSEU Negative 06/18/2020 0839   BILIRUBINUR Negative 06/18/2020 0839   PROTEINUR Trace (A) 06/18/2020 0839   UROBILINOGEN 0.2 08/29/2019 0927   NITRITE Positive (A) 06/18/2020 0839   LEUKOCYTESUR Trace (A) 06/18/2020 0839    Lab Results  Component Value Date   LABMICR See below: 06/18/2020   WBCUA 6-10 (A) 06/18/2020   LABEPIT >10 (A) 06/18/2020   MUCUS Present 06/18/2020   BACTERIA Many (A) 06/18/2020    Pertinent Imaging: KUB today: Images reviewed and discussed with the patient Results for orders placed during the hospital encounter of 06/24/20  DG Abd 1 View  Narrative CLINICAL DATA:  Left nephrolithiasis  EXAM: ABDOMEN - 1 VIEW  COMPARISON:  06/18/2020  FINDINGS: Stable 4 mm x 6 mm calculus overlying the expected distal right ureter. No additional nephro or urolithiasis. Involuted fibroid noted within the mid pelvis. Normal abdominal gas pattern. Mild lumbar levoscoliosis.  IMPRESSION: Stable right urolithiasis.   Electronically Signed By: Fidela Salisbury MD On: 06/24/2020 14:26  No results found for this or any previous visit.  No results found for this or any previous visit.  No results  found for this or any previous visit.  No results found for this or any previous visit.  No results found for this or any previous visit.  No results found for this or any previous visit.  No results found for this or any previous visit.   Assessment & Plan:    1. Nephrolithiasis -RTC 2 weeks with KUB - Urinalysis, Routine w reflex microscopic   No follow-ups on file.  Nicolette Bang, MD  [facility] [place]

## 2020-07-11 NOTE — Patient Instructions (Signed)

## 2020-07-11 NOTE — Addendum Note (Signed)
Addended by: Cleon Gustin on: 07/11/2020 02:24 PM   Modules accepted: Orders

## 2020-07-11 NOTE — Progress Notes (Signed)

## 2020-07-16 LAB — CALCULI, WITH PHOTOGRAPH (CLINICAL LAB)
Calcium Oxalate Dihydrate: 30 %
Calcium Oxalate Monohydrate: 70 %
Weight Calculi: 83 mg

## 2020-07-25 ENCOUNTER — Ambulatory Visit (HOSPITAL_COMMUNITY)
Admission: RE | Admit: 2020-07-25 | Discharge: 2020-07-25 | Disposition: A | Discharge status: Home / Self Care | Payer: Medicare Other | Source: Ambulatory Visit | Attending: Urology | Admitting: Urology

## 2020-07-25 DIAGNOSIS — N200 Calculus of kidney: Secondary | ICD-10-CM | POA: Insufficient documentation

## 2020-07-30 ENCOUNTER — Ambulatory Visit (INDEPENDENT_AMBULATORY_CARE_PROVIDER_SITE_OTHER): Payer: Medicare Other | Admitting: Urology

## 2020-07-30 ENCOUNTER — Encounter: Payer: Self-pay | Admitting: Urology

## 2020-07-30 ENCOUNTER — Other Ambulatory Visit: Payer: Self-pay

## 2020-07-30 VITALS — BP 160/87 | HR 80 | Temp 98.1°F | Ht 64.0 in | Wt 187.0 lb

## 2020-07-30 DIAGNOSIS — N200 Calculus of kidney: Secondary | ICD-10-CM

## 2020-07-30 LAB — URINALYSIS, ROUTINE W REFLEX MICROSCOPIC
Bilirubin, UA: NEGATIVE
Glucose, UA: NEGATIVE
Ketones, UA: NEGATIVE
Leukocytes,UA: NEGATIVE
Nitrite, UA: NEGATIVE
Protein,UA: NEGATIVE
RBC, UA: NEGATIVE
Specific Gravity, UA: 1.02 (ref 1.005–1.030)
Urobilinogen, Ur: 0.2 mg/dL (ref 0.2–1.0)
pH, UA: 5.5 (ref 5.0–7.5)

## 2020-07-30 NOTE — Progress Notes (Signed)
07/30/2020 10:57 AM   Eliseo Squires Leeroy Bock March 20, 1956 [id number]  Referring provider: Celene Squibb, MD [address] [place],  [place] [address]  followup nephrolithiasis  HPI: Ms Bergemann is a 65yo here for followup for nephrolithiasis. No flank pain. No LUTS. KUB shows no calculi.    PMH: Past Medical History:  Diagnosis Date  . Complication of anesthesia    pt has headaches after anesthesia  . Elevated BP 03/19/2013  . History of kidney stones   . Lactose intolerance     Surgical History: Past Surgical History:  Procedure Laterality Date  . [place]  . COLONOSCOPY N/A 12/17/2016   Procedure: COLONOSCOPY;  Surgeon: Danie Binder, MD;  [facility];  Service: Endoscopy;  Laterality: N/A;  8:30am  . EXTRACORPOREAL SHOCK WAVE LITHOTRIPSY Left 09/03/2019   Procedure: EXTRACORPOREAL SHOCK WAVE LITHOTRIPSY (ESWL);  Surgeon: Franchot Gallo, MD;  Location: [facility];  Service: Urology;  Laterality: Left;  . EXTRACORPOREAL SHOCK WAVE LITHOTRIPSY Right 06/24/2020   Procedure: EXTRACORPOREAL SHOCK WAVE LITHOTRIPSY (ESWL);  Surgeon: Cleon Gustin, MD;  Location: [facility];  Service: Urology;  Laterality: Right;  . TOTAL KNEE ARTHROPLASTY Right 01/01/2015   Procedure: RIGHT TOTAL KNEE REPLACEMENT;  Surgeon: Carole Civil, MD;  Location[facility];  Service: Orthopedics;  Laterality: Right;    Home Medications:  Allergies as of 07/30/2020   No Known Allergies     Medication List       Accurate as of July 30, 2020 10:57 AM. If you have any questions, ask your nurse or doctor.        STOP taking these medications   amoxicillin-clavulanate 875-125 MG tablet Commonly known as: AUGMENTIN Stopped by: Nicolette Bang, MD   ondansetron 4 MG tablet Commonly known as: Zofran Stopped by: Nicolette Bang, MD   oxyCODONE-acetaminophen 5-325 MG tablet Commonly known as: Percocet Stopped by: Nicolette Bang, MD    tamsulosin 0.4 MG Caps capsule Commonly known as: Flomax Stopped by: Nicolette Bang, MD     TAKE these medications   acetaminophen 500 MG tablet Commonly known as: TYLENOL Take 500-1,000 mg by mouth 2 (two) times daily as needed for moderate pain or headache.   bismuth subsalicylate 412 IN/86VE suspension Commonly known as: PEPTO BISMOL Take 30 mLs by mouth every 6 (six) hours as needed for indigestion or diarrhea or loose stools.   co-enzyme Q-10 30 MG capsule Take 100 mg by mouth every other day.   GOODY HEADACHE PO Take 1 packet by mouth as needed (headache).   levocetirizine 5 MG tablet Commonly known as: XYZAL Take 2.5 mg by mouth at bedtime.   multivitamin tablet Take 1 tablet by mouth daily.   neomycin-bacitracin-polymyxin ointment Commonly known as: NEOSPORIN Apply 1 application topically as needed for wound care.   VITAMIN D PO Take 3,000 mg by mouth.   white petrolatum Gel Commonly known as: VASELINE Apply 1 application topically as needed (sores).       Allergies: No Known Allergies  Family History: Family History  Problem Relation Age of Onset  . Macular degeneration Father   . Cancer Maternal Aunt        breast  . Cancer Mother        ovarian    Social History:  reports that she has never smoked. She has never used smokeless tobacco. She reports that she does not drink alcohol and does not use drugs.  ROS: All other  review of systems were reviewed and are negative except what is noted above in HPI  Physical Exam: BP (!) 160/87   Pulse 80   Temp 98.1 F (36.7 C)   Ht 5\' 4"  (1.626 m)   Wt 187 lb (84.8 kg)   BMI 32.10 kg/m   Constitutional:  Alert and oriented, No acute distress. HEENT: [place] AT, moist mucus membranes.  Trachea midline, no masses. Cardiovascular: No clubbing, cyanosis, or edema. Respiratory: Normal respiratory effort, no increased work of breathing. GI: Abdomen is soft, nontender, nondistended, no abdominal masses GU:  No CVA tenderness.  Lymph: No cervical or inguinal lymphadenopathy. Skin: No rashes, bruises or suspicious lesions. Neurologic: Grossly intact, no focal deficits, moving all 4 extremities. Psychiatric: Normal mood and affect.  Laboratory Data: Lab Results  Component Value Date   WBC 7.8 01/03/2015   HGB 11.8 (L) 01/03/2015   HCT 36.1 01/03/2015   MCV 95.0 01/03/2015   PLT 208 01/03/2015    Lab Results  Component Value Date   CREATININE 0.60 01/02/2015    No results found for: PSA  No results found for: TESTOSTERONE  No results found for: HGBA1C  Urinalysis    Component Value Date/Time   APPEARANCEUR Clear 07/11/2020 1328   GLUCOSEU Negative 07/11/2020 1328   BILIRUBINUR Negative 07/11/2020 1328   PROTEINUR Negative 07/11/2020 1328   UROBILINOGEN 0.2 08/29/2019 0927   NITRITE Negative 07/11/2020 1328   LEUKOCYTESUR 1+ (A) 07/11/2020 1328    Lab Results  Component Value Date   LABMICR See below: 07/11/2020   WBCUA 6-10 (A) 07/11/2020   LABEPIT >10 (A) 07/11/2020   MUCUS Present 06/18/2020   BACTERIA Moderate (A) 07/11/2020    Pertinent Imaging: KUB 07/25/2020: Images reviewed and discussed with the patient Results for orders placed during the hospital encounter of 07/25/20  Abdomen 1 view (KUB)  Narrative CLINICAL DATA:  Nephrolithiasis  EXAM: ABDOMEN - 1 VIEW  COMPARISON:  July 11, 2020  FINDINGS: Calcification in the right pelvis is stable and likely represents calcified uterine leiomyoma. No other abnormal calcification evident. There is moderate stool in the colon. There is no bowel dilatation or air-fluid level to suggest bowel obstruction. No free air. There is lumbar levoscoliosis.  IMPRESSION: Probable calcified uterine leiomyoma in right pelvis, stable. No other abnormal calcifications. No bowel obstruction or free air. Moderate stool in colon.   Electronically Signed By: Lowella Grip III M.D. On: 07/27/2020 09:29  No  results found for this or any previous visit.  No results found for this or any previous visit.  No results found for this or any previous visit.  No results found for this or any previous visit.  No results found for this or any previous visit.  No results found for this or any previous visit.  No results found for this or any previous visit.   Assessment & Plan:    1. Nephrolithiasis -RTC 6 months with Renal US and KUB - Urinalysis, Routine w reflex microscopic   Return in about 6 months (around 01/30/2021) for KUB.  Nicolette Bang, MD  [facility] [place]

## 2020-07-30 NOTE — Patient Instructions (Signed)

## 2020-07-30 NOTE — Progress Notes (Signed)
Urological Symptom Review ° °Patient is experiencing the following symptoms: °Get up at night to urinate °Kidney stones ° °Review of Systems ° °Gastrointestinal (upper)  : °Negative for upper GI symptoms ° °Gastrointestinal (lower) : °Negative for lower GI symptoms ° °Constitutional : °Negative for symptoms ° °Skin: °Negative for skin symptoms ° °Eyes: °Negative for eye symptoms ° °Ear/Nose/Throat : °Negative for Ear/Nose/Throat symptoms ° °Hematologic/Lymphatic: °Negative for Hematologic/Lymphatic symptoms ° °Cardiovascular : °Negative for cardiovascular symptoms ° °Respiratory : °Negative for respiratory symptoms ° °Endocrine: °Negative for endocrine symptoms ° °Musculoskeletal: °Negative for musculoskeletal symptoms ° °Neurological: °Negative for neurological symptoms ° °Psychologic: °Negative for psychiatric symptoms °

## 2020-09-26 ENCOUNTER — Other Ambulatory Visit: Payer: Medicare Other | Admitting: Adult Health

## 2021-01-30 ENCOUNTER — Ambulatory Visit (HOSPITAL_COMMUNITY): Payer: Medicare Other

## 2021-02-04 ENCOUNTER — Ambulatory Visit: Payer: Medicare Other | Admitting: Urology

## 2021-02-05 ENCOUNTER — Ambulatory Visit (HOSPITAL_COMMUNITY): Payer: Medicare Other

## 2021-02-12 ENCOUNTER — Other Ambulatory Visit: Payer: Self-pay

## 2021-02-12 ENCOUNTER — Ambulatory Visit (HOSPITAL_COMMUNITY)
Admission: RE | Admit: 2021-02-12 | Discharge: 2021-02-12 | Disposition: A | Discharge status: Home / Self Care | Payer: Medicare Other | Source: Ambulatory Visit | Attending: Urology | Admitting: Urology

## 2021-02-12 DIAGNOSIS — N200 Calculus of kidney: Secondary | ICD-10-CM | POA: Insufficient documentation

## 2021-02-18 ENCOUNTER — Telehealth: Payer: Self-pay

## 2021-02-18 NOTE — Telephone Encounter (Signed)
-----   Message from Cleon Gustin, MD sent at 02/17/2021  8:54 AM EDT ----- Normal renal US ----- Message ----- From: Dorisann Frames, RN Sent: 02/16/2021   9:45 AM EDT To: Cleon Gustin, MD  Appt 10/18

## 2021-02-18 NOTE — Telephone Encounter (Signed)
Patient returned call and made aware of negative [place] results.

## 2021-03-03 ENCOUNTER — Telehealth: Payer: Medicare Other | Admitting: Urology

## 2021-08-18 ENCOUNTER — Telehealth: Payer: Medicare Other | Admitting: Urology

## 2021-08-21 ENCOUNTER — Ambulatory Visit (INDEPENDENT_AMBULATORY_CARE_PROVIDER_SITE_OTHER): Payer: Medicare Other | Admitting: Urology

## 2021-08-21 ENCOUNTER — Encounter: Payer: Self-pay | Admitting: Urology

## 2021-08-21 VITALS — BP 168/93 | HR 80

## 2021-08-21 DIAGNOSIS — N200 Calculus of kidney: Secondary | ICD-10-CM | POA: Diagnosis not present

## 2021-08-21 LAB — URINALYSIS, ROUTINE W REFLEX MICROSCOPIC
Bilirubin, UA: NEGATIVE
Glucose, UA: NEGATIVE
Ketones, UA: NEGATIVE
Leukocytes,UA: NEGATIVE
Nitrite, UA: NEGATIVE
Protein,UA: NEGATIVE
RBC, UA: NEGATIVE
Specific Gravity, UA: 1.01 (ref 1.005–1.030)
Urobilinogen, Ur: 0.2 mg/dL (ref 0.2–1.0)
pH, UA: 7 (ref 5.0–7.5)

## 2021-08-21 NOTE — Patient Instructions (Signed)
Dietary Guidelines to Help Prevent Kidney Stones Kidney stones are deposits of minerals and salts that form inside your kidneys. Your risk of developing kidney stones may be greater depending on your diet, your lifestyle, the medicines you take, and whether you have certain medical conditions. Most people can lower their chances of developing kidney stones by following the instructions below. Your dietitian may give you more specific instructions depending on your overall health and the type of kidney stones you tend to develop. What are tips for following this plan? Reading food labels  Choose foods with "no salt added" or "low-salt" labels. Limit your salt (sodium) intake to less than 1,500 mg a day. Choose foods with calcium for each meal and snack. Try to eat about 300 mg of calcium at each meal. Foods that contain 200-500 mg of calcium a serving include: 8 oz (237 mL) of milk, calcium-fortifiednon-dairy milk, and calcium-fortifiedfruit juice. Calcium-fortified means that calcium has been added to these drinks. 8 oz (237 mL) of kefir, yogurt, and soy yogurt. 4 oz (114 g) of tofu. 1 oz (28 g) of cheese. 1 cup (150 g) of dried figs. 1 cup (91 g) of cooked broccoli. One 3 oz (85 g) can of sardines or mackerel. Most people need 1,000-1,500 mg of calcium a day. Talk to your dietitian about how much calcium is recommended for you. Shopping Buy plenty of fresh fruits and vegetables. Most people do not need to avoid fruits and vegetables, even if these foods contain nutrients that may contribute to kidney stones. When shopping for convenience foods, choose: Whole pieces of fruit. Pre-made salads with dressing on the side. Low-fat fruit and yogurt smoothies. Avoid buying frozen meals or prepared deli foods. These can be high in sodium. Look for foods with live cultures, such as yogurt and kefir. Choose high-fiber grains, such as whole-wheat breads, oat bran, and wheat cereals. Cooking Do not add  salt to food when cooking. Place a salt shaker on the table and allow each person to add his or her own salt to taste. Use vegetable protein, such as beans, textured vegetable protein (TVP), or tofu, instead of meat in pasta, casseroles, and soups. Meal planning Eat less salt, if told by your dietitian. To do this: Avoid eating processed or pre-made food. Avoid eating fast food. Eat less animal protein, including cheese, meat, poultry, or fish, if told by your dietitian. To do this: Limit the number of times you have meat, poultry, fish, or cheese each week. Eat a diet free of meat at least 2 days a week. Eat only one serving each day of meat, poultry, fish, or seafood. When you prepare animal protein, cut pieces into small portion sizes. For most meat and fish, one serving is about the size of the palm of your hand. Eat at least five servings of fresh fruits and vegetables each day. To do this: Keep fruits and vegetables on hand for snacks. Eat one piece of fruit or a handful of berries with breakfast. Have a salad and fruit at lunch. Have two kinds of vegetables at dinner. Limit foods that are high in a substance called oxalate. These include: Spinach (cooked), rhubarb, beets, sweet potatoes, and Swiss chard. Peanuts. Potato chips, Heuer fries, and baked potatoes with skin on. Nuts and nut products. Chocolate. If you regularly take a diuretic medicine, make sure to eat at least 1 or 2 servings of fruits or vegetables that are high in potassium each day. These include: Avocado. Banana. Orange, prune,   carrot, or tomato juice. Baked potato. Cabbage. Beans and split peas. Lifestyle  Drink enough fluid to keep your urine pale yellow. This is the most important thing you can do. Spread your fluid intake throughout the day. If you drink alcohol: Limit how much you use to: 0-1 drink a day for women who are not pregnant. 0-2 drinks a day for men. Be aware of how much alcohol is in your  drink. In the [place] one drink equals one 12 oz bottle of beer (355 mL), one 5 oz glass of wine (148 mL), or one 1 oz glass of hard liquor (44 mL). Lose weight if told by your health care provider. Work with your dietitian to find an eating plan and weight loss strategies that work best for you. General information Talk to your health care provider and dietitian about taking daily supplements. You may be told the following depending on your health and the cause of your kidney stones: Not to take supplements with vitamin C. To take a calcium supplement. To take a daily probiotic supplement. To take other supplements such as magnesium, fish oil, or vitamin B6. Take over-the-counter and prescription medicines only as told by your health care provider. These include supplements. What foods should I limit? Limit your intake of the following foods, or eat them as told by your dietitian. Vegetables Spinach. Rhubarb. Beets. Canned vegetables. Pickles. Olives. Baked potatoes with skin. Grains Wheat bran. Baked goods. Salted crackers. Cereals high in sugar. Meats and other proteins Nuts. Nut butters. Large portions of meat, poultry, or fish. Salted, precooked, or cured meats, such as sausages, meat loaves, and hot dogs. Dairy Cheese. Beverages Regular soft drinks. Regular vegetable juice. Seasonings and condiments Seasoning blends with salt. Salad dressings. Soy sauce. Ketchup. Barbecue sauce. Other foods Canned soups. Canned pasta sauce. Casseroles. Pizza. Lasagna. Frozen meals. Potato chips. Aumiller fries. The items listed above may not be a complete list of foods and beverages you should limit. Contact a dietitian for more information. What foods should I avoid? Talk to your dietitian about specific foods you should avoid based on the type of kidney stones you have and your overall health. Fruits Grapefruit. The item listed above may not be a complete list of foods and beverages you should  avoid. Contact a dietitian for more information. Summary Kidney stones are deposits of minerals and salts that form inside your kidneys. You can lower your risk of kidney stones by making changes to your diet. The most important thing you can do is drink enough fluid. Drink enough fluid to keep your urine pale yellow. Talk to your dietitian about how much calcium you should have each day, and eat less salt and animal protein as told by your dietitian. This information is not intended to replace advice given to you by your health care provider. Make sure you discuss any questions you have with your health care provider. Document Revised: 04/26/2019 Document Reviewed: 04/26/2019 Elsevier Patient Education  2022 Elsevier Inc.  

## 2021-08-21 NOTE — Progress Notes (Signed)
? ?08/21/2021 ?12:34 PM  ? ?Eliseo Squires Beigel ?[birth year] ?[id number] ? ?Referring provider: Celene Squibb, MD ?[address] ?[address] ?[place],  [place] [address] ? ?Followup nephrolithiasis ? ? ?HPI: ?Ms Rausch is a 66yo here for followup for nephrolithiasis. No stone events since last visit. Renal US 01/2021 shows no calculi. She drinks 2 quarts of water daily. No significant LUTS. No other complaints today. ? ? ?PMH: ?Past Medical History:  ?Diagnosis Date  ? Complication of anesthesia   ? pt has headaches after anesthesia  ? Elevated BP 03/19/2013  ? History of kidney stones   ? Lactose intolerance   ? ? ?Surgical History: ?Past Surgical History:  ?Procedure Laterality Date  ? [place]  ? COLONOSCOPY N/A 12/17/2016  ? Procedure: COLONOSCOPY;  Surgeon: Danie Binder, MD;  [facility];  Service: Endoscopy;  Laterality: N/A;  8:30am  ? EXTRACORPOREAL SHOCK WAVE LITHOTRIPSY Left 09/03/2019  ? Procedure: EXTRACORPOREAL SHOCK WAVE LITHOTRIPSY (ESWL);  Surgeon: Franchot Gallo, MD;  [facility];  Service: Urology;  Laterality: Left;  ? EXTRACORPOREAL SHOCK WAVE LITHOTRIPSY Right 06/24/2020  ? Procedure: EXTRACORPOREAL SHOCK WAVE LITHOTRIPSY (ESWL);  Surgeon: Cleon Gustin, MD;  Location: [facility];  Service: Urology;  Laterality: Right;  ? TOTAL KNEE ARTHROPLASTY Right 01/01/2015  ? Procedure: RIGHT TOTAL KNEE REPLACEMENT;  Surgeon: Carole Civil, MD;  Location: [facility];  Service: Orthopedics;  Laterality: Right;  ? ? ?Home Medications:  ?Allergies as of 08/21/2021   ?No Known Allergies ?  ? ?  ?Medication List  ?  ? ?  ? Accurate as of August 21, 2021 12:34 PM. If you have any questions, ask your nurse or doctor.  ?  ?  ? ?  ? ?acetaminophen 500 MG tablet ?Commonly known as: TYLENOL ?Take 500-1,000 mg by mouth 2 (two) times daily as needed for moderate pain or headache. ?  ?bismuth subsalicylate 332 RJ/18AC suspension ?Commonly known as: PEPTO BISMOL ?Take 30  mLs by mouth every 6 (six) hours as needed for indigestion or diarrhea or loose stools. ?  ?co-enzyme Q-10 30 MG capsule ?Take 100 mg by mouth every other day. ?  ?GOODY HEADACHE PO ?Take 1 packet by mouth as needed (headache). ?  ?levocetirizine 5 MG tablet ?Commonly known as: XYZAL ?Take 2.5 mg by mouth at bedtime. ?  ?loratadine 10 MG tablet ?Commonly known as: CLARITIN ?Take 10 mg by mouth daily. ?  ?losartan 25 MG tablet ?Commonly known as: COZAAR ?Take 25 mg by mouth at bedtime. ?  ?multivitamin tablet ?Take 1 tablet by mouth daily. ?  ?neomycin-bacitracin-polymyxin ointment ?Commonly known as: NEOSPORIN ?Apply 1 application topically as needed for wound care. ?  ?VITAMIN D PO ?Take 3,000 mg by mouth. ?  ?white petrolatum Gel ?Commonly known as: VASELINE ?Apply 1 application topically as needed (sores). ?  ? ?  ? ? ?Allergies: No Known Allergies ? ?Family History: ?Family History  ?Problem Relation Age of Onset  ? Macular degeneration Father   ? Cancer Maternal Aunt   ?     breast  ? Cancer Mother   ?     ovarian  ? ? ?Social History:  reports that she has never smoked. She has never used smokeless tobacco. She reports that she does not drink alcohol and does not use drugs. ? ?ROS: ?All other review of systems were reviewed and are negative except what is noted above in HPI ? ?Physical Exam: ?BP [name])  168/93   Pulse 80   ?Constitutional:  Alert and oriented, No acute distress. ?HEENT: [place] AT, moist mucus membranes.  Trachea midline, no masses. ?Cardiovascular: No clubbing, cyanosis, or edema. ?Respiratory: Normal respiratory effort, no increased work of breathing. ?GI: Abdomen is soft, nontender, nondistended, no abdominal masses ?GU: No CVA tenderness.  ?Lymph: No cervical or inguinal lymphadenopathy. ?Skin: No rashes, bruises or suspicious lesions. ?Neurologic: Grossly intact, no focal deficits, moving all 4 extremities. ?Psychiatric: Normal mood and affect. ? ?Laboratory Data: ?Lab Results  ?Component Value  Date  ? WBC 7.8 01/03/2015  ? HGB 11.8 (L) 01/03/2015  ? HCT 36.1 01/03/2015  ? MCV 95.0 01/03/2015  ? PLT 208 01/03/2015  ? ? ?Lab Results  ?Component Value Date  ? CREATININE 0.60 01/02/2015  ? ? ?No results found for: PSA ? ?No results found for: TESTOSTERONE ? ?No results found for: HGBA1C ? ?Urinalysis ?   ?Component Value Date/Time  ? APPEARANCEUR Clear 07/30/2020 1057  ? GLUCOSEU Negative 07/30/2020 1057  ? BILIRUBINUR Negative 07/30/2020 1057  ? PROTEINUR Negative 07/30/2020 1057  ? UROBILINOGEN 0.2 08/29/2019 0927  ? NITRITE Negative 07/30/2020 1057  ? LEUKOCYTESUR Negative 07/30/2020 1057  ? ? ?Lab Results  ?Component Value Date  ? LABMICR Comment 07/30/2020  ? [place] 6-10 (A) 07/11/2020  ? LABEPIT >10 (A) 07/11/2020  ? MUCUS Present 06/18/2020  ? BACTERIA Moderate (A) 07/11/2020  ? ? ?Pertinent Imaging: ?Renal US 02/12/2021: Images reviewed and discussed with the patient  ?Results for orders placed during the hospital encounter of 07/25/20 ? ?Abdomen 1 view (KUB) ? ?Narrative ?CLINICAL DATA:  Nephrolithiasis ? ?EXAM: ?ABDOMEN - 1 VIEW ? ?COMPARISON:  July 11, 2020 ? ?FINDINGS: ?Calcification in the right pelvis is stable and likely represents ?calcified uterine leiomyoma. No other abnormal calcification ?evident. There is moderate stool in the colon. There is no bowel ?dilatation or air-fluid level to suggest bowel obstruction. No free ?air. There is lumbar levoscoliosis. ? ?IMPRESSION: ?Probable calcified uterine leiomyoma in right pelvis, stable. No ?other abnormal calcifications. No bowel obstruction or free air. ?Moderate stool in colon. ? ? ?Electronically Signed ?By: Lowella Grip III M.D. ?On: 07/27/2020 09:29 ? ?No results found for this or any previous visit. ? ?No results found for this or any previous visit. ? ?No results found for this or any previous visit. ? ?Results for orders placed during the hospital encounter of 02/12/21 ? ?Ultrasound renal complete ? ?Narrative ?CLINICAL DATA:   Nephrolithiasis. ? ?EXAM: ?RENAL / URINARY TRACT ULTRASOUND COMPLETE ? ?COMPARISON:  CT abdomen and pelvis 10/09/2019. ? ?FINDINGS: ?Right Kidney: ? ?Renal measurements: 9.7 x 4.4 x 5.8 cm = volume: 129 mL. ?Echogenicity within normal limits. No mass or hydronephrosis ?visualized. ? ?Left Kidney: ? ?Renal measurements: 10.7 x 5.4 x 4.1 cm = volume: 123 mL. ?Echogenicity within normal limits. No mass or hydronephrosis ?visualized. ? ?Bladder: ? ?Appears normal for degree of bladder distention. ? ?Other: ? ?None. ? ?IMPRESSION: ?Normal renal ultrasound. ? ? ?Electronically Signed ?By: Ronney Asters M.D. ?On: 02/13/2021 23:58 ? ?No results found for this or any previous visit. ? ?No results found for this or any previous visit. ? ?No results found for this or any previous visit. ? ? ?Assessment & Plan:   ? ?1. Nephrolithiasis ?-dietary handout given ?-RTC 1 year with a renal US ?- Urinalysis, Routine w reflex microscopic ? ? ?No follow-ups on file. ? ?Nicolette Bang, MD ? ?[facility] [place] ?  ?

## 2021-08-25 ENCOUNTER — Ambulatory Visit: Payer: Medicare Other | Admitting: Urology

## 2021-11-16 ENCOUNTER — Other Ambulatory Visit: Payer: Self-pay | Admitting: Internal Medicine

## 2021-11-18 ENCOUNTER — Other Ambulatory Visit: Payer: Self-pay | Admitting: Internal Medicine

## 2022-02-25 IMAGING — CT CT ABD-PELV W/O CM
2 of 4 series · 17 of 46 positions shown, 19 images · non-contrast
Comparison: None.

CLINICAL DATA: Subjective hematuria

EXAM:
CT ABDOMEN AND PELVIS WITHOUT CONTRAST
TECHNIQUE: Multidetector CT imaging of the abdomen and pelvis was performed
following the standard protocol without IV contrast.

[Series 2: axial st · axial · 0.70mm/px · z∈[+1114,+1498]mm · 14 of 89 slices shown, 16 images]
[im 6/89  soft-tissue]
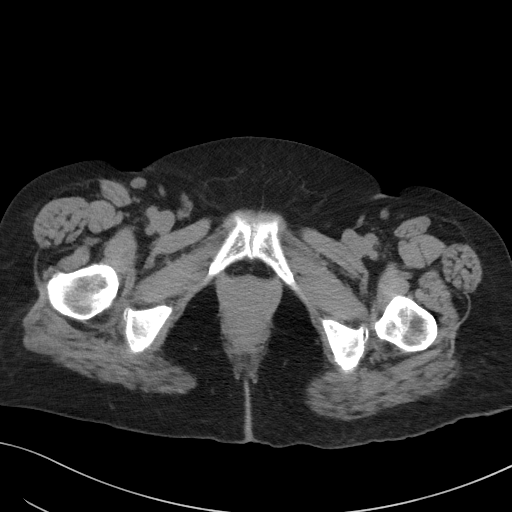
[im 6/89  bone]
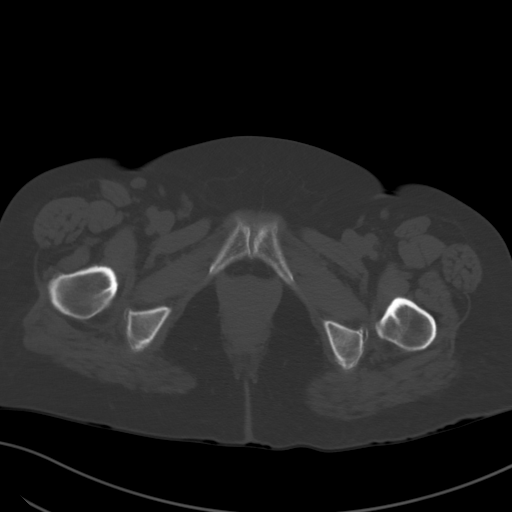
[im 12/89  soft-tissue]
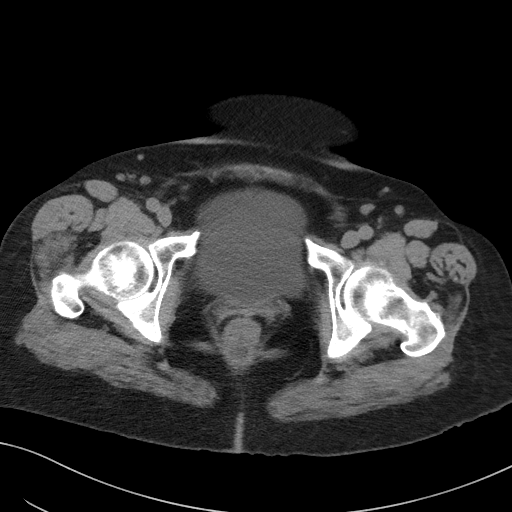
[im 17/89  soft-tissue]
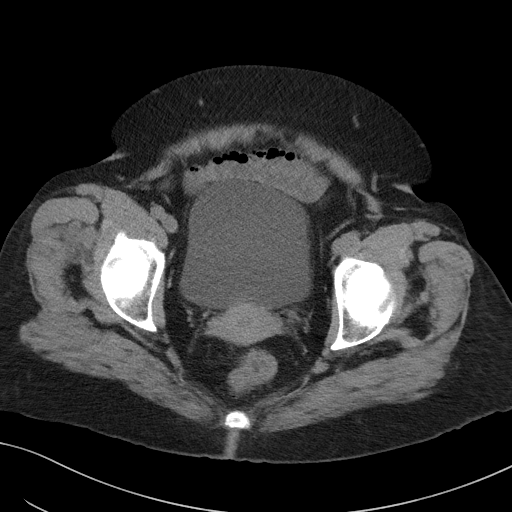
[im 23/89  soft-tissue]
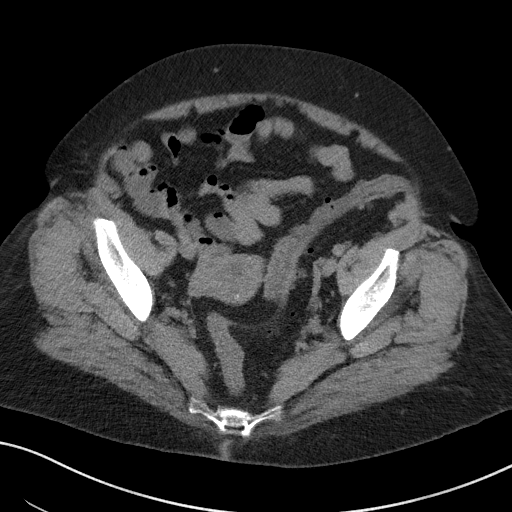
[im 28/89  soft-tissue]
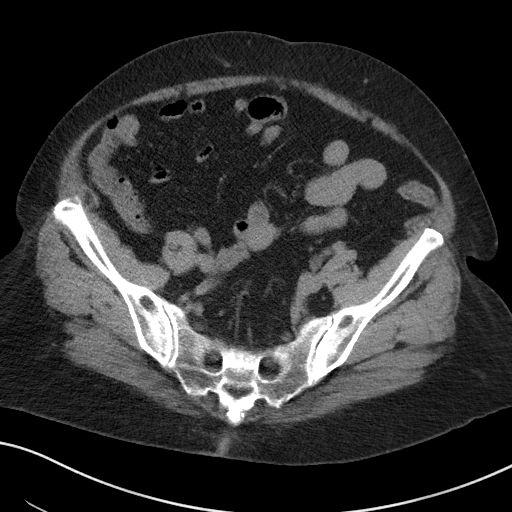
[im 34/89  soft-tissue]
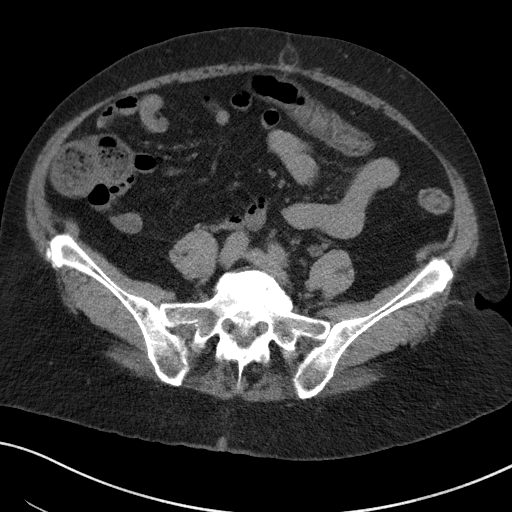
[im 39/89  soft-tissue]
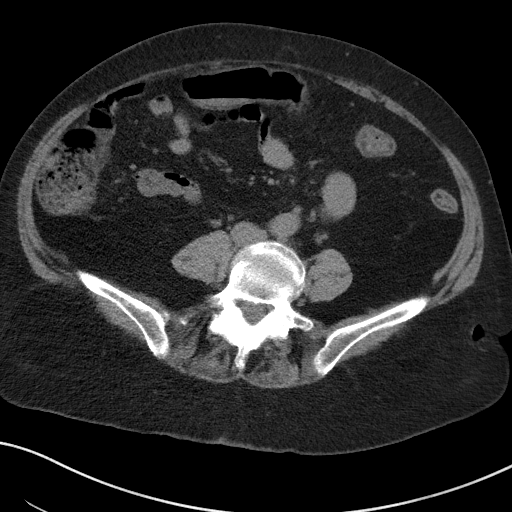
[im 50/89  soft-tissue]
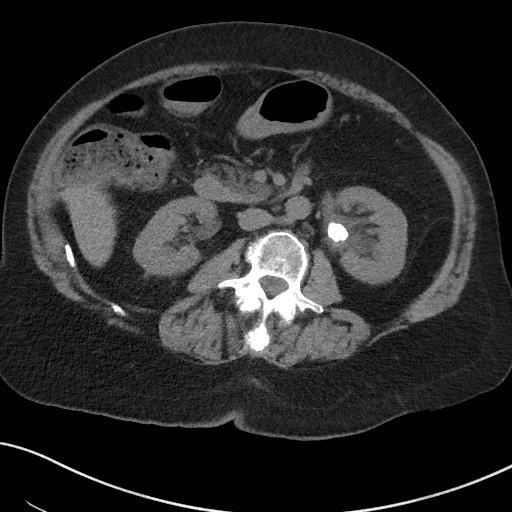
[im 56/89  soft-tissue]
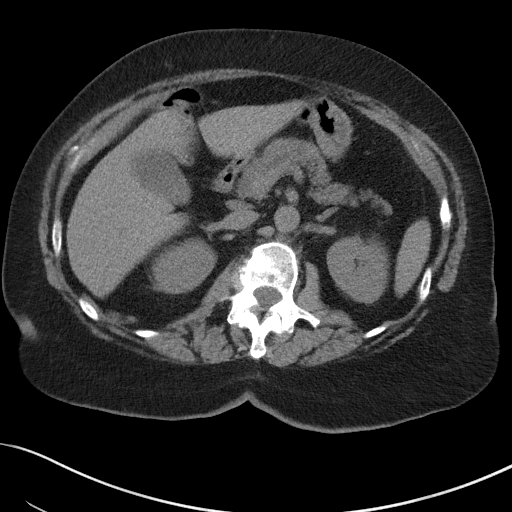
[im 56/89  bone]
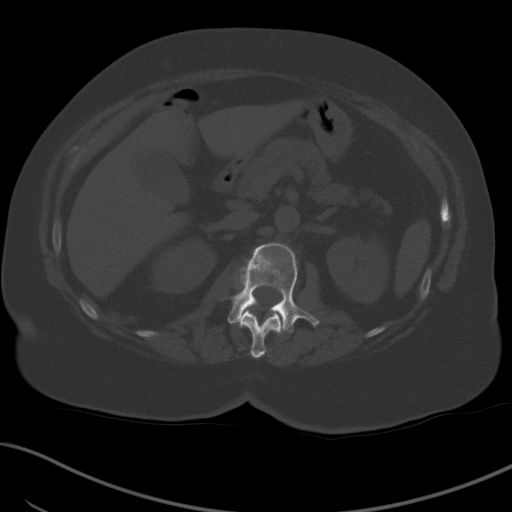
[im 61/89  soft-tissue]
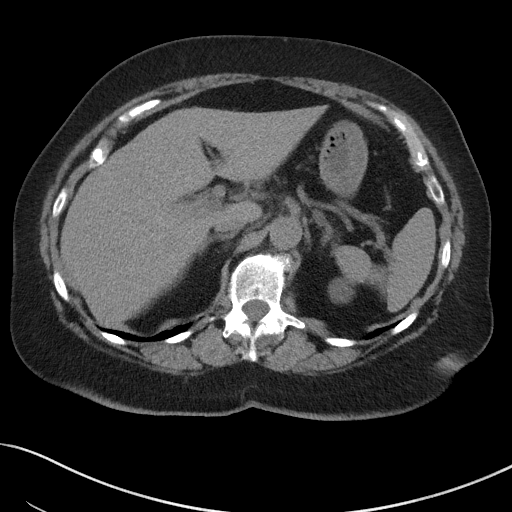
[im 67/89  soft-tissue]
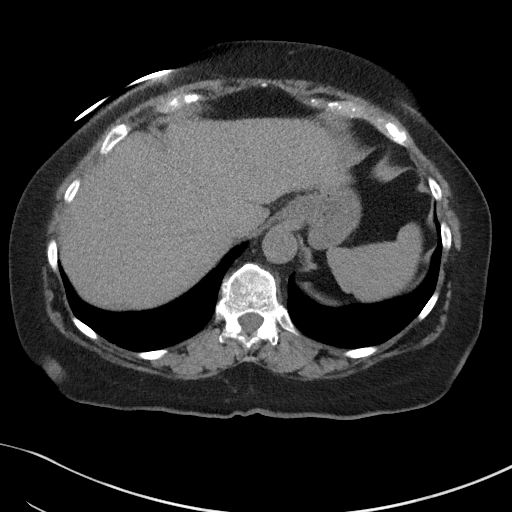
[im 72/89  soft-tissue]
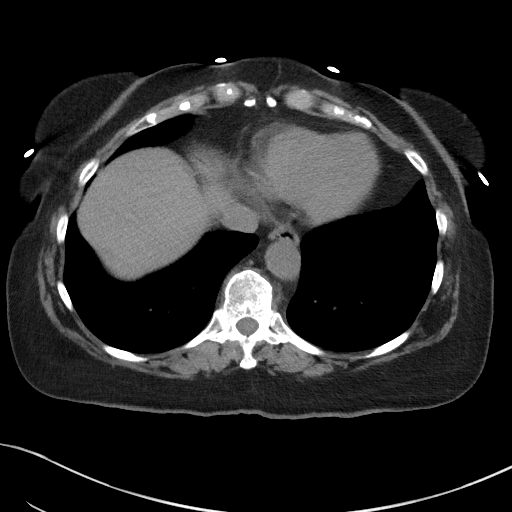
[im 78/89  soft-tissue]
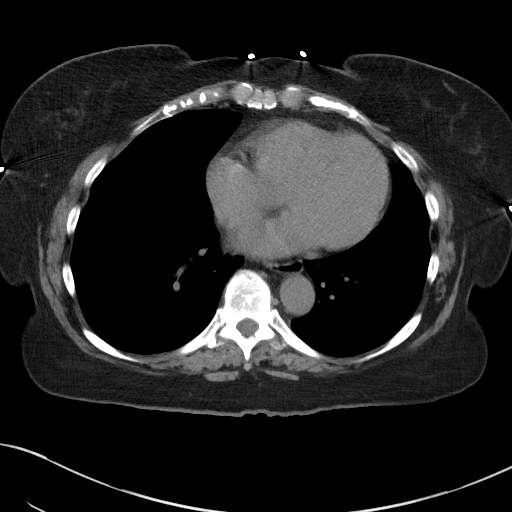
[im 83/89  soft-tissue]
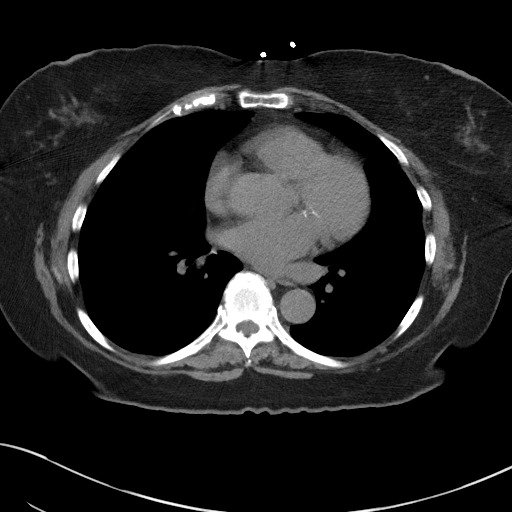

[Series 5: coronal st · coronal · 0.80mm/px · 3 of 127 slices shown]
[im 43/127  soft-tissue]
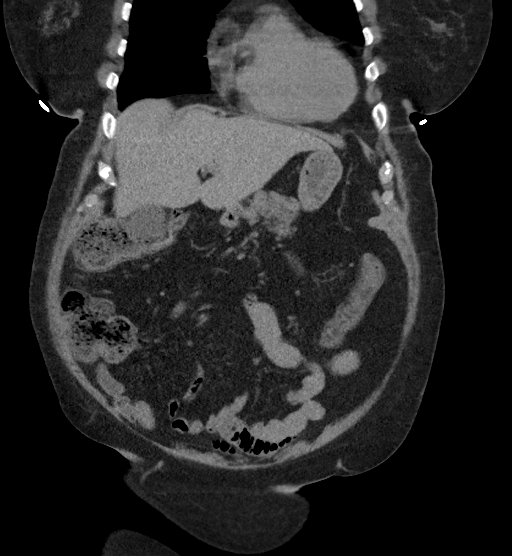
[im 57/127  soft-tissue]
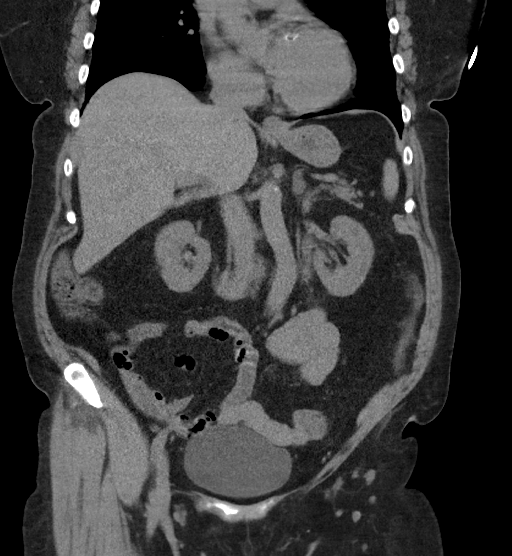
[im 71/127  soft-tissue]
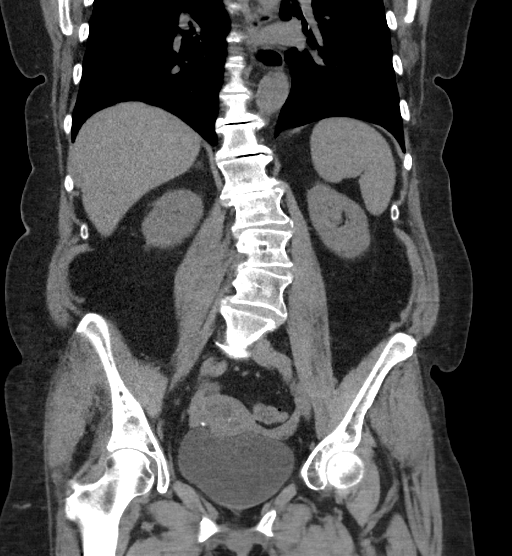

[17 of 46 positions shown; findings below may reference images not displayed]

FINDINGS: LOWER CHEST: Normal.

HEPATOBILIARY: Normal hepatic contours. No intra- or extrahepatic
biliary dilatation. The gallbladder is normal.

PANCREAS: Normal pancreas. No ductal dilatation or peripancreatic
fluid collection.

SPLEEN: Normal.

ADRENALS/URINARY TRACT: The adrenal glands are normal. There are
multiple renal calculi. On the right, there is a calculus at the
lower portion of the renal pelvis that measures 5 mm. No right
hydroureteronephrosis. The right ureter is clear. On the left, there
is a large calculus within the renal pelvis, measuring 1.6 x 1.3 cm.
There is mild hydronephrosis. There is a punctate left lower pole
calculus. There is mild left hydroureter but no visible ureteral
obstruction. The urinary bladder is normal for degree of distention

STOMACH/BOWEL: There is no hiatal hernia. Normal duodenal course and
caliber. No small bowel dilatation or inflammation. No focal colonic
abnormality. The appendix is not visualized. No right lower quadrant
inflammation or free fluid.

VASCULAR/LYMPHATIC: Normal course and caliber of the major abdominal
vessels. No abdominal or pelvic lymphadenopathy.

REPRODUCTIVE: Calcified right fundal uterine fibroid.

MUSCULOSKELETAL. Multilevel facet arthrosis with grade 1 L4-5
anterolisthesis.

OTHER: None.
IMPRESSION: 1. 1.6 x 1.3 cm calculus within the left renal pelvis with mild
hydronephrosis.
2. Multiple additional bilateral renal calculi.

## 2022-08-16 ENCOUNTER — Ambulatory Visit (HOSPITAL_COMMUNITY)
Admission: RE | Admit: 2022-08-16 | Discharge: 2022-08-16 | Disposition: A | Discharge status: Home / Self Care | Payer: Medicare Other | Source: Ambulatory Visit | Attending: Urology | Admitting: Urology

## 2022-08-16 DIAGNOSIS — N200 Calculus of kidney: Secondary | ICD-10-CM | POA: Diagnosis not present

## 2022-08-27 ENCOUNTER — Ambulatory Visit: Payer: Medicare Other | Admitting: Urology

## 2022-08-27 ENCOUNTER — Encounter: Payer: Self-pay | Admitting: Urology

## 2022-08-27 VITALS — BP 152/86 | HR 82

## 2022-08-27 DIAGNOSIS — Z09 Encounter for follow-up examination after completed treatment for conditions other than malignant neoplasm: Secondary | ICD-10-CM | POA: Diagnosis not present

## 2022-08-27 DIAGNOSIS — Z87442 Personal history of urinary calculi: Secondary | ICD-10-CM | POA: Diagnosis not present

## 2022-08-27 DIAGNOSIS — N200 Calculus of kidney: Secondary | ICD-10-CM

## 2022-08-27 LAB — URINALYSIS, ROUTINE W REFLEX MICROSCOPIC
Bilirubin, UA: NEGATIVE
Glucose, UA: NEGATIVE
Ketones, UA: NEGATIVE
Leukocytes,UA: NEGATIVE
Nitrite, UA: NEGATIVE
Protein,UA: NEGATIVE
RBC, UA: NEGATIVE
Specific Gravity, UA: <1.005 — ABNORMAL LOW (ref 1.005–1.030)
Urobilinogen, Ur: 0.2 mg/dL (ref 0.2–1.0)
pH, UA: 6 (ref 5.0–7.5)

## 2022-08-27 NOTE — Patient Instructions (Signed)

## 2022-08-27 NOTE — Progress Notes (Unsigned)
08/27/2022 9:35 AM   Erica Davis Janice Norrie 1955-11-03 [id number]  Referring provider: Benita Stabile, MD [address] [place],  [place] [address]  No chief complaint on file.   HPI: No stone events since last visit. She drinks 2 quarts of water daily. Renal US shows no calculi.    PMH: Past Medical History:  Diagnosis Date   Complication of anesthesia    pt has headaches after anesthesia   Elevated BP 03/19/2013   History of kidney stones    Lactose intolerance     Surgical History: Past Surgical History:  Procedure Laterality Date   CESAREAN SECTION  1978   COLONOSCOPY N/A 12/17/2016   Procedure: COLONOSCOPY;  Surgeon: West Bali, MD;  [facility];  Service: Endoscopy;  Laterality: N/A;  8:30am   EXTRACORPOREAL SHOCK WAVE LITHOTRIPSY Left 09/03/2019   Procedure: EXTRACORPOREAL SHOCK WAVE LITHOTRIPSY (ESWL);  Surgeon: Marcine Matar, MD;  Location: [facility];  Service: Urology;  Laterality: Left;   EXTRACORPOREAL SHOCK WAVE LITHOTRIPSY Right 06/24/2020   Procedure: EXTRACORPOREAL SHOCK WAVE LITHOTRIPSY (ESWL);  Surgeon: Malen Gauze, MD;  Location: [facility];  Service: Urology;  Laterality: Right;   TOTAL KNEE ARTHROPLASTY Right 01/01/2015   Procedure: RIGHT TOTAL KNEE REPLACEMENT;  Surgeon: Vickki Hearing, MD;  Location: [facility];  Service: Orthopedics;  Laterality: Right;    Home Medications:  Allergies as of 08/27/2022   No Known Allergies      Medication List        Accurate as of August 27, 2022  9:35 AM. If you have any questions, ask your nurse or doctor.          acetaminophen 500 MG tablet Commonly known as: TYLENOL Take 500-1,000 mg by mouth 2 (two) times daily as needed for moderate pain or headache.   AZO-STANDARD PO Take by mouth.   bismuth subsalicylate 262 MG/15ML suspension Commonly known as: PEPTO BISMOL Take 30 mLs by mouth every 6 (six) hours as needed for indigestion or diarrhea or  loose stools.   co-enzyme Q-10 30 MG capsule Take 100 mg by mouth every other day.   GOODY HEADACHE PO Take 1 packet by mouth as needed (headache).   levocetirizine 5 MG tablet Commonly known as: XYZAL Take 2.5 mg by mouth at bedtime.   loratadine 10 MG tablet Commonly known as: CLARITIN Take 10 mg by mouth daily.   losartan 25 MG tablet Commonly known as: COZAAR Take 25 mg by mouth at bedtime.   multivitamin tablet Take 1 tablet by mouth daily.   neomycin-bacitracin-polymyxin [id number] ointment Commonly known as: NEOSPORIN Apply 1 application topically as needed for wound care.   VITAMIN D PO Take 3,000 mg by mouth.   white petrolatum Gel Commonly known as: VASELINE Apply 1 application topically as needed (sores).        Allergies: No Known Allergies  Family History: Family History  Problem Relation Age of Onset   Macular degeneration Father    Cancer Maternal Aunt        breast   Cancer Mother        ovarian    Social History:  reports that she has never smoked. She has never used smokeless tobacco. She reports that she does not drink alcohol and does not use drugs.  ROS: All other review of systems were reviewed and are negative except what is noted above in HPI  Physical Exam: BP (!) 152/86   Pulse  82   Constitutional:  Alert and oriented, No acute distress. HEENT: [place] AT, moist mucus membranes.  Trachea midline, no masses. Cardiovascular: No clubbing, cyanosis, or edema. Respiratory: Normal respiratory effort, no increased work of breathing. GI: Abdomen is soft, nontender, nondistended, no abdominal masses GU: No CVA tenderness.  Lymph: No cervical or inguinal lymphadenopathy. Skin: No rashes, bruises or suspicious lesions. Neurologic: Grossly intact, no focal deficits, moving all 4 extremities. Psychiatric: Normal mood and affect.  Laboratory Data: Lab Results  Component Value Date   WBC 7.8 01/03/2015   HGB 11.8 (L) 01/03/2015   HCT  36.1 01/03/2015   MCV 95.0 01/03/2015   PLT 208 01/03/2015    Lab Results  Component Value Date   CREATININE 0.60 01/02/2015    No results found for: "PSA"  No results found for: "TESTOSTERONE"  No results found for: "HGBA1C"  Urinalysis    Component Value Date/Time   APPEARANCEUR Clear 08/21/2021 1241   GLUCOSEU Negative 08/21/2021 1241   BILIRUBINUR Negative 08/21/2021 1241   PROTEINUR Negative 08/21/2021 1241   UROBILINOGEN 0.2 08/29/2019 0927   NITRITE Negative 08/21/2021 1241   LEUKOCYTESUR Negative 08/21/2021 1241    Lab Results  Component Value Date   LABMICR Comment 08/21/2021   WBCUA 6-10 (A) 07/11/2020   LABEPIT >10 (A) 07/11/2020   MUCUS Present 06/18/2020   BACTERIA Moderate (A) 07/11/2020    Pertinent Imaging: *** Results for orders placed during the hospital encounter of 07/25/20  Abdomen 1 view (KUB)  Narrative CLINICAL DATA:  Nephrolithiasis  EXAM: ABDOMEN - 1 VIEW  COMPARISON:  July 11, 2020  FINDINGS: Calcification in the right pelvis is stable and likely represents calcified uterine leiomyoma. No other abnormal calcification evident. There is moderate stool in the colon. There is no bowel dilatation or air-fluid level to suggest bowel obstruction. No free air. There is lumbar levoscoliosis.  IMPRESSION: Probable calcified uterine leiomyoma in right pelvis, stable. No other abnormal calcifications. No bowel obstruction or free air. Moderate stool in colon.   Electronically Signed By: Bretta Bang III M.D. On: 07/27/2020 09:29  No results found for this or any previous visit.  No results found for this or any previous visit.  No results found for this or any previous visit.  Results for orders placed during the hospital encounter of 08/16/22  Ultrasound renal complete  Narrative CLINICAL DATA:  nephrolithiasis  EXAM: RENAL / URINARY TRACT ULTRASOUND COMPLETE  COMPARISON:  February 12, 2021, July 20, 2019.  FINDINGS: Right Kidney:  Renal measurements: 10.2 x 4.6 x 4.5 cm = volume: 112 mL. Echogenicity within normal limits. No mass or hydronephrosis visualized.  Left Kidney:  Renal measurements: 10.9 x 4.9 x 4.8 cm = volume: 135 mL. Echogenicity within normal limits. No suspicious mass or hydronephrosis visualized. Benign 2.8 cm cyst (for which no dedicated imaging follow-up is recommended).  Bladder:  Appears normal for degree of bladder distention.  Other:  None.  IMPRESSION: No hydronephrosis.   Electronically Signed By: Meda Klinefelter M.D. On: 08/16/2022 13:59  No valid procedures specified. No results found for this or any previous visit.  No results found for this or any previous visit.   Assessment & Plan:    1. Nephrolithiasis *** - Urinalysis, Routine w reflex microscopic   No follow-ups on file.  Wilkie Aye, MD  [facility] [place]

## 2022-09-22 ENCOUNTER — Other Ambulatory Visit (HOSPITAL_COMMUNITY): Payer: Self-pay | Admitting: Adult Health Nurse Practitioner

## 2022-09-22 DIAGNOSIS — M8589 Other specified disorders of bone density and structure, multiple sites: Secondary | ICD-10-CM

## 2022-10-01 ENCOUNTER — Ambulatory Visit (HOSPITAL_COMMUNITY)
Admission: RE | Admit: 2022-10-01 | Discharge: 2022-10-01 | Disposition: A | Discharge status: Home / Self Care | Payer: Medicare Other | Source: Ambulatory Visit | Attending: Adult Health Nurse Practitioner | Admitting: Adult Health Nurse Practitioner

## 2022-10-01 DIAGNOSIS — M8589 Other specified disorders of bone density and structure, multiple sites: Secondary | ICD-10-CM | POA: Diagnosis present

## 2022-12-01 IMAGING — DX DG ABDOMEN 1V
2 series · 2 of 2 positions shown · non-contrast
Comparison: 11/07/2019

CLINICAL DATA: 64-year-old female with nephrolithiasis

EXAM:
ABDOMEN - 1 VIEW

[abdomen kub (1 of 2)]
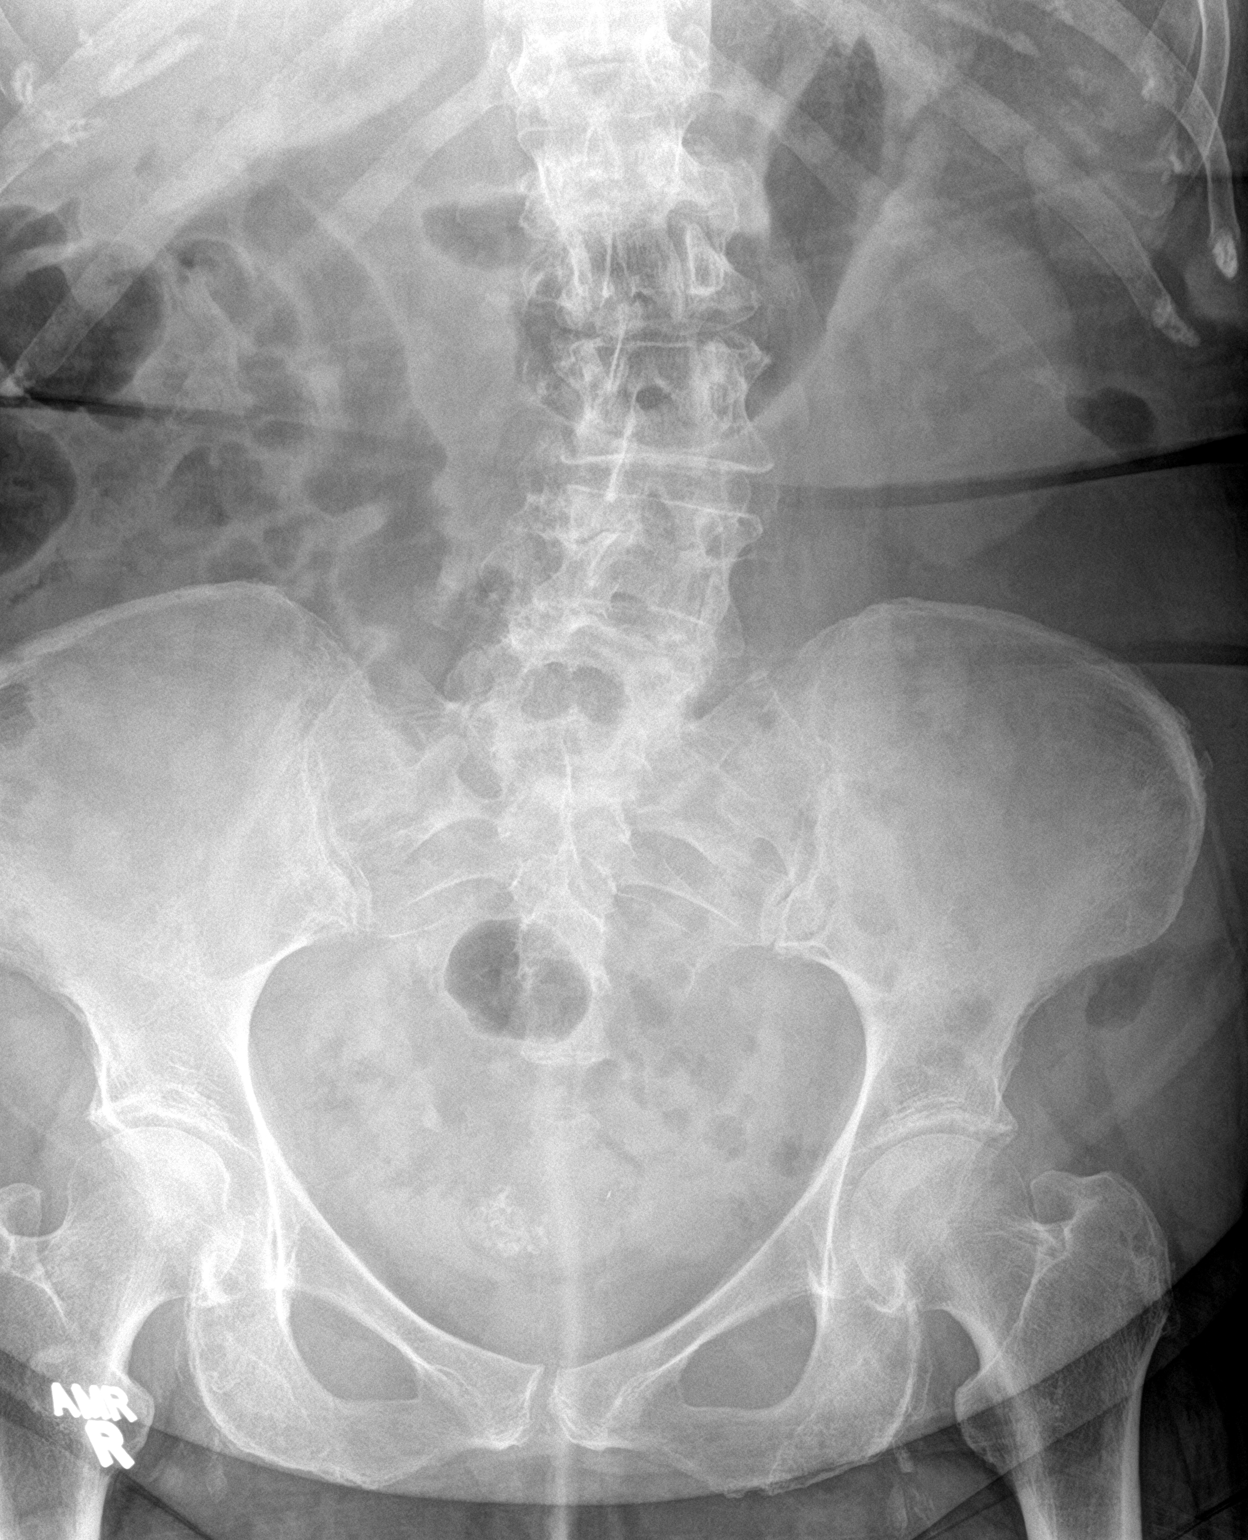

[abdomen kub (2 of 2)]
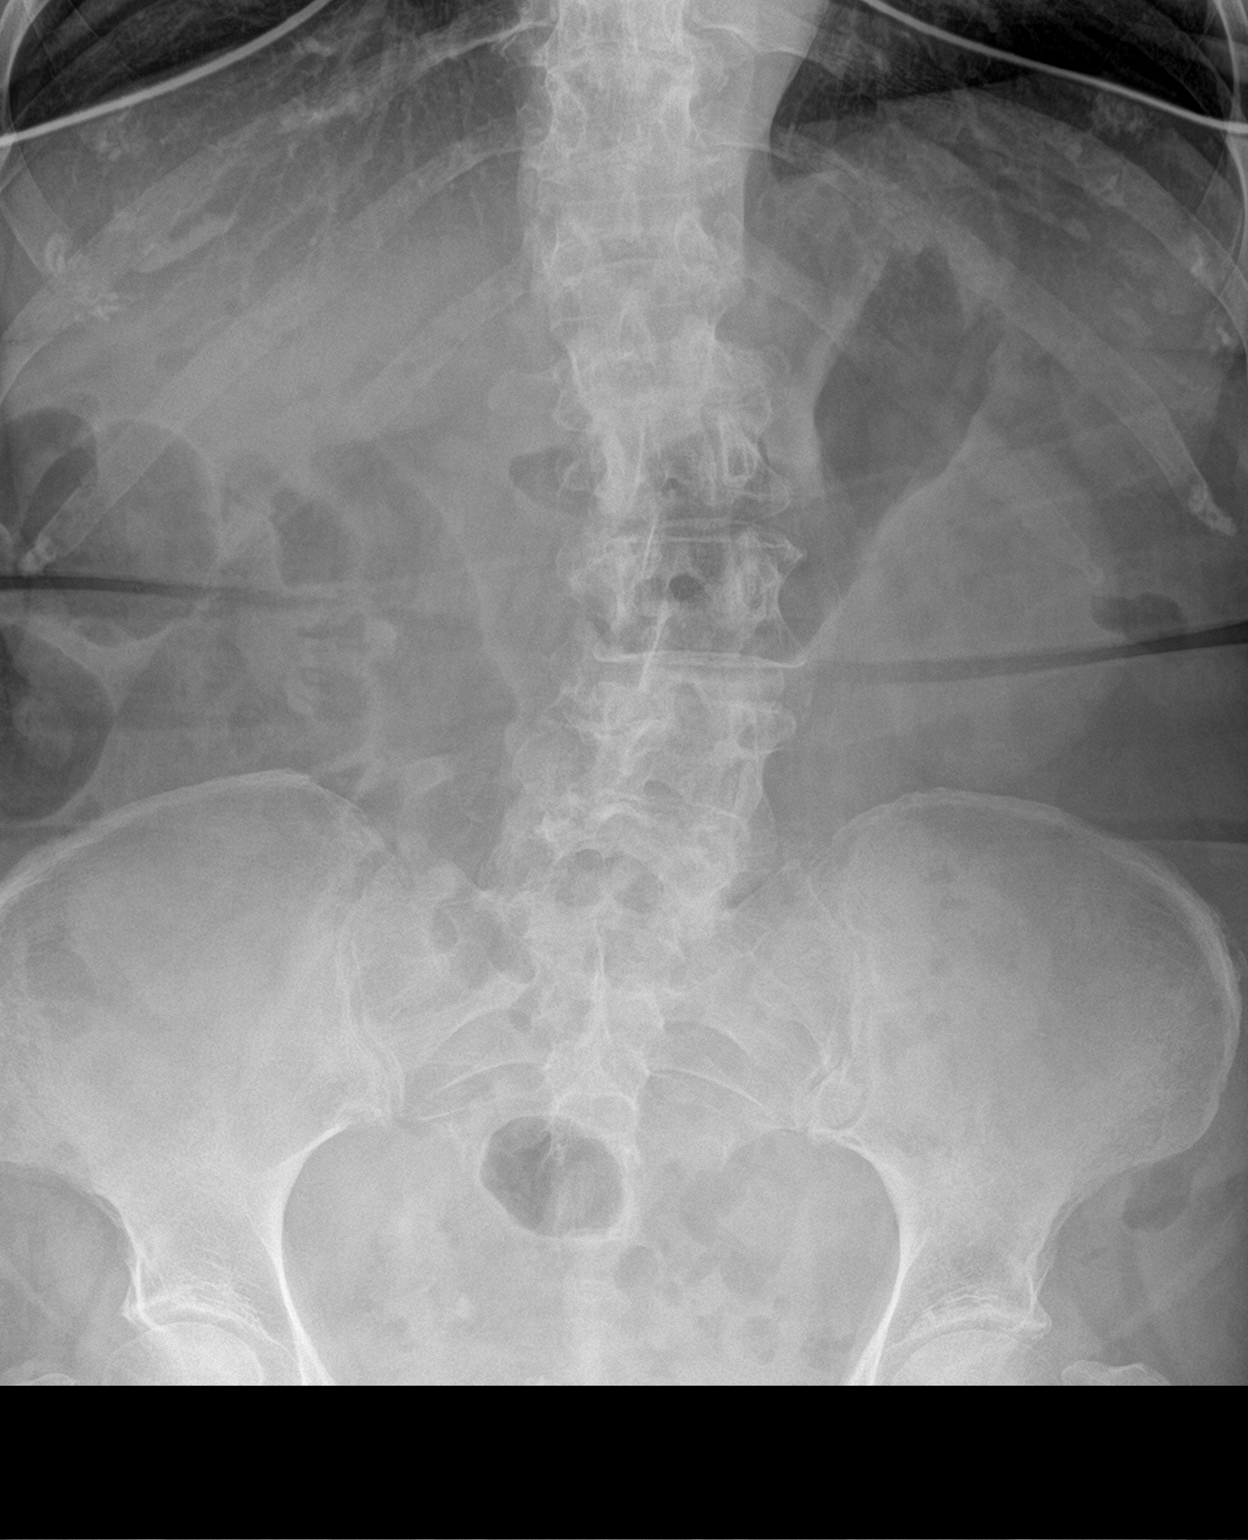

[2 of 2 positions shown; findings below may reference images not displayed]

FINDINGS: Gas within stomach small bowel and colon.  No abnormal distention.

No air-fluid levels.

No calcifications projecting in the region the left renal
silhouette.

No calcifications in the right abdomen. The prior geometric calcific
density in the right abdomen is not visualized, although the region
obscured by bowel gas.

Partially calcified fibroid within the end time pelvis.

Geometric density in the right anatomic pelvis, new from the
comparison. Is similar size to the prior calcification in the right
abdomen.

No displaced fracture.  Degenerative changes of the spine.
IMPRESSION: New geometric calcification in the right anatomic pelvis,
approximately 6 mm, appears to correlate to the prior right-sided
renal calculus, and would represent new distal right ureteral stone.
CT may be considered for confirmation, and to evaluate for any
possible hydronephrosis.

No evidence of left renal calcifications.

Fibroid uterus.

## 2023-01-05 IMAGING — DX DG ABDOMEN 1V
1 series · 1 of 1 positions shown · non-contrast
Comparison: None.

CLINICAL DATA: Nephrolithiasis

EXAM:
ABDOMEN - 1 VIEW

[abdomen kub]
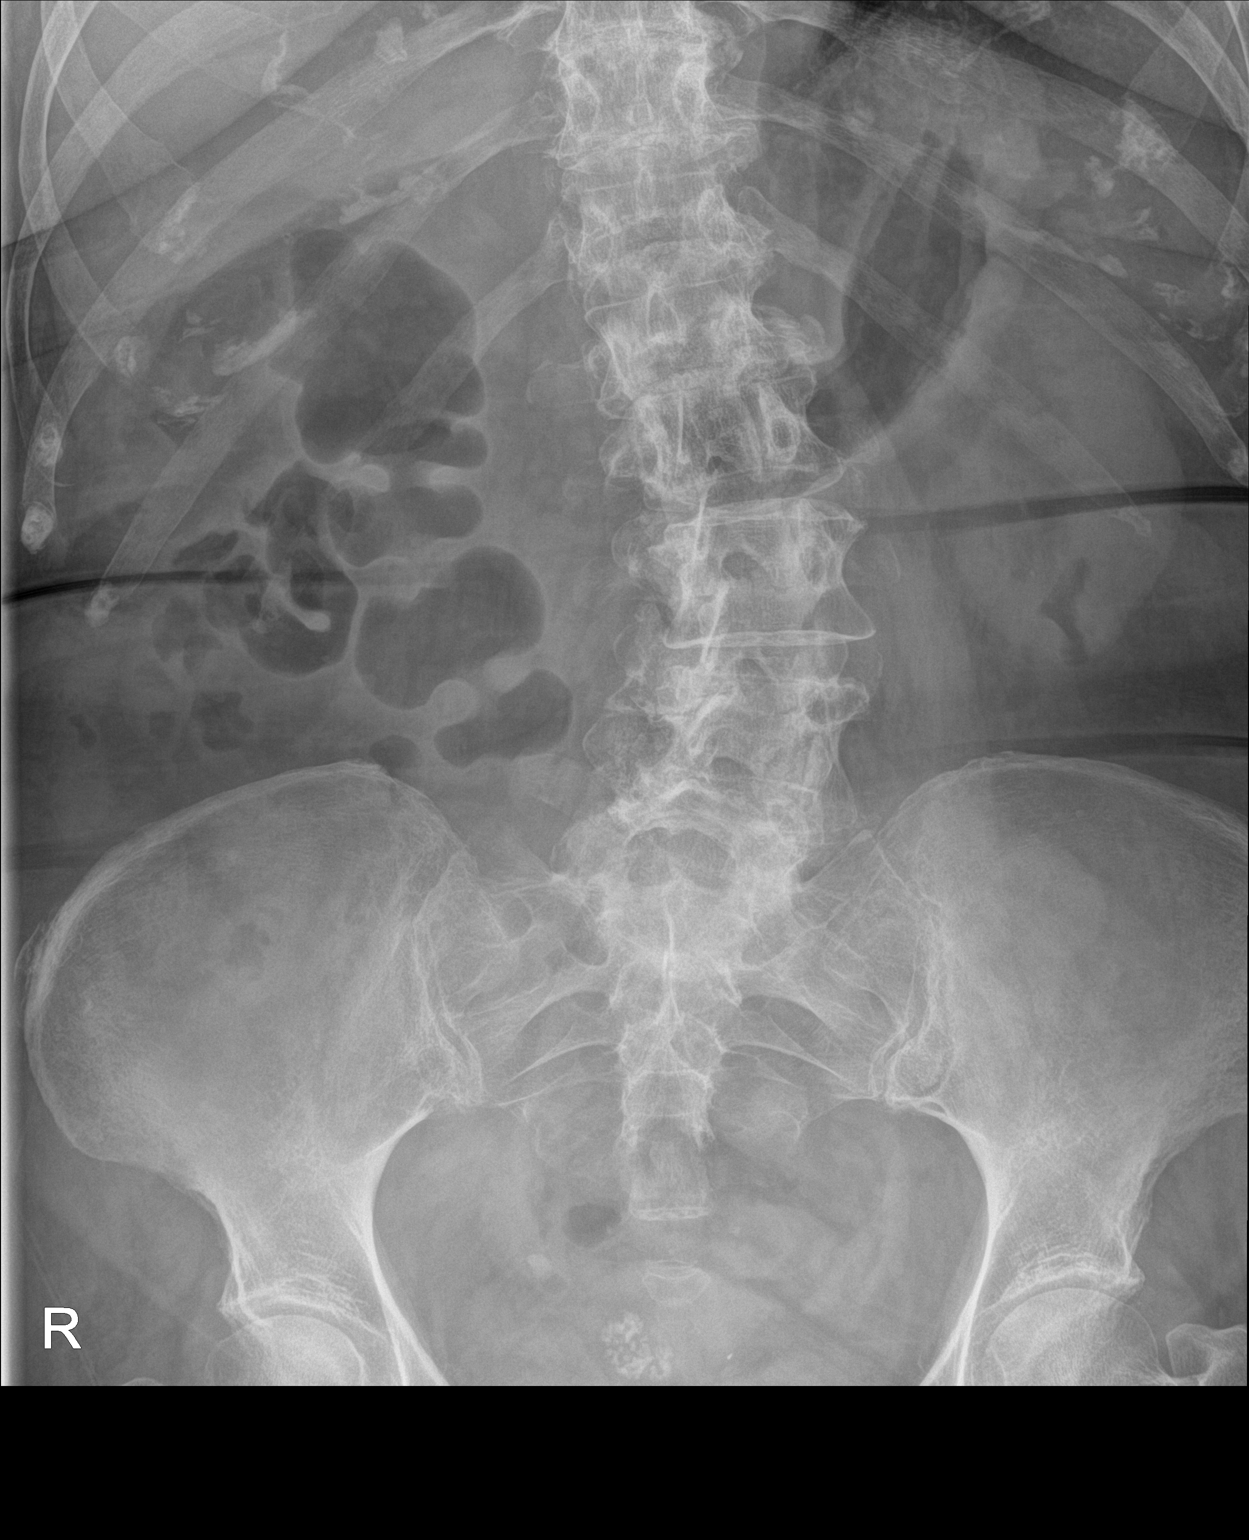

[1 of 1 positions shown; findings below may reference images not displayed]

FINDINGS: No bowel dilatation to suggest obstruction. No evidence of
pneumoperitoneum, portal venous gas or pneumatosis.

4 mm calcification along the course of the right distal ureteral
calculus unchanged compared with 05/14/2020 concerning for a distal
ureteral calculus. Small coarsely calcified uterine fibroid
incidentally noted.

No acute osseous abnormality. Levoscoliosis of the lumbar spine.
IMPRESSION: A 4 mm calcification along the course of the right distal ureteral
calculus unchanged compared with 05/14/2020 concerning for a distal
ureteral calculus.

## 2023-06-30 NOTE — Telephone Encounter (Signed)
See below

## 2023-07-01 NOTE — Telephone Encounter (Signed)
See below

## 2023-08-08 ENCOUNTER — Ambulatory Visit (HOSPITAL_COMMUNITY)
Admission: RE | Admit: 2023-08-08 | Discharge: 2023-08-08 | Disposition: A | Discharge status: Home / Self Care | Payer: Medicare Other | Source: Ambulatory Visit | Attending: Urology | Admitting: Urology

## 2023-08-08 DIAGNOSIS — N200 Calculus of kidney: Secondary | ICD-10-CM | POA: Insufficient documentation

## 2023-08-29 ENCOUNTER — Ambulatory Visit: Payer: Medicare Other | Admitting: Urology

## 2023-09-23 ENCOUNTER — Ambulatory Visit: Payer: Medicare Other | Admitting: Urology

## 2023-09-23 ENCOUNTER — Encounter: Payer: Self-pay | Admitting: Urology

## 2023-09-23 VITALS — BP 145/84 | HR 87

## 2023-09-23 DIAGNOSIS — Z87442 Personal history of urinary calculi: Secondary | ICD-10-CM

## 2023-09-23 DIAGNOSIS — N200 Calculus of kidney: Secondary | ICD-10-CM

## 2023-09-23 DIAGNOSIS — Z09 Encounter for follow-up examination after completed treatment for conditions other than malignant neoplasm: Secondary | ICD-10-CM

## 2023-09-23 LAB — URINALYSIS, ROUTINE W REFLEX MICROSCOPIC
Bilirubin, UA: NEGATIVE
Glucose, UA: NEGATIVE
Ketones, UA: NEGATIVE
Leukocytes,UA: NEGATIVE
Nitrite, UA: NEGATIVE
Protein,UA: NEGATIVE
RBC, UA: NEGATIVE
Specific Gravity, UA: <1.005 — ABNORMAL LOW (ref 1.005–1.030)
Urobilinogen, Ur: 0.2 mg/dL (ref 0.2–1.0)
pH, UA: 6 (ref 5.0–7.5)

## 2023-09-23 NOTE — Patient Instructions (Signed)

## 2023-09-23 NOTE — Progress Notes (Unsigned)
 09/23/2023 10:07 AM   Erica Davis 12-08-1955 [id number]  Referring provider: Lorre Rosin, NP [address] [place],  [place] [address]  No chief complaint on file.   HPI: Ms Boatright is a 68yo here for followup for nephrolithiasis. No stone events since last visit. She drinks 2 quarts of water  daily and 1-2 glasses of tea daily. Renal US  shows no calculi. No significant LUTS   PMH: Past Medical History:  Diagnosis Date   Complication of anesthesia    pt has headaches after anesthesia   Elevated BP 03/19/2013   History of kidney stones    Lactose intolerance     Surgical History: Past Surgical History:  Procedure Laterality Date   CESAREAN SECTION  1978   COLONOSCOPY N/A 12/17/2016   Procedure: COLONOSCOPY;  Surgeon: Alyce Jubilee, MD;  Location: [facility];  Service: Endoscopy;  Laterality: N/A;  8:30am   EXTRACORPOREAL SHOCK WAVE LITHOTRIPSY Left 09/03/2019   Procedure: EXTRACORPOREAL SHOCK WAVE LITHOTRIPSY (ESWL);  Surgeon: Trent Frizzle, MD;  [facility];  Service: Urology;  Laterality: Left;   EXTRACORPOREAL SHOCK WAVE LITHOTRIPSY Right 06/24/2020   Procedure: EXTRACORPOREAL SHOCK WAVE LITHOTRIPSY (ESWL);  Surgeon: Marco Severs, MD;  Location: [facility];  Service: Urology;  Laterality: Right;   TOTAL KNEE ARTHROPLASTY Right 01/01/2015   Procedure: RIGHT TOTAL KNEE REPLACEMENT;  Surgeon: Darrin Emerald, MD;  Location: [facility];  Service: Orthopedics;  Laterality: Right;    Home Medications:  Allergies as of 09/23/2023   No Known Allergies      Medication List        Accurate as of Sep 23, 2023 10:07 AM. If you have any questions, ask your nurse or doctor.          acetaminophen  500 MG tablet Commonly known as: TYLENOL  Take 500-1,000 mg by mouth 2 (two) times daily as needed for moderate pain or headache.   AZO-STANDARD PO Take by mouth.   bismuth subsalicylate 262 MG/15ML  suspension Commonly known as: PEPTO BISMOL Take 30 mLs by mouth every 6 (six) hours as needed for indigestion or diarrhea or loose stools.   co-enzyme Q-10 30 MG capsule Take 100 mg by mouth every other day.   GOODY HEADACHE PO Take 1 packet by mouth as needed (headache).   levocetirizine 5 MG tablet Commonly known as: XYZAL Take 2.5 mg by mouth at bedtime.   loratadine  10 MG tablet Commonly known as: CLARITIN  Take 10 mg by mouth daily.   losartan 25 MG tablet Commonly known as: COZAAR Take 25 mg by mouth at bedtime.   multivitamin tablet Take 1 tablet by mouth daily.   neomycin-bacitracin-polymyxin [id number] ointment Commonly known as: NEOSPORIN Apply 1 application topically as needed for wound care.   VITAMIN D  PO Take 3,000 mg by mouth.   white petrolatum Gel Commonly known as: VASELINE Apply 1 application topically as needed (sores).        Allergies: No Known Allergies  Family History: Family History  Problem Relation Age of Onset   Macular degeneration Father    Cancer Maternal Aunt        breast   Cancer Mother        ovarian    Social History:  reports that she has never smoked. She has never used smokeless tobacco. She reports that she does not drink alcohol and does not use drugs.  ROS: All other review of systems were reviewed  and are negative except what is noted above in HPI  Physical Exam: BP (!) 145/84   Pulse 87   Constitutional:  Alert and oriented, No acute distress. HEENT: [place] AT, moist mucus membranes.  Trachea midline, no masses. Cardiovascular: No clubbing, cyanosis, or edema. Respiratory: Normal respiratory effort, no increased work of breathing. GI: Abdomen is soft, nontender, nondistended, no abdominal masses GU: No CVA tenderness.  Lymph: No cervical or inguinal lymphadenopathy. Skin: No rashes, bruises or suspicious lesions. Neurologic: Grossly intact, no focal deficits, moving all 4 extremities. Psychiatric: Normal mood  and affect.  Laboratory Data: Lab Results  Component Value Date   WBC 7.8 01/03/2015   HGB 11.8 (L) 01/03/2015   HCT 36.1 01/03/2015   MCV 95.0 01/03/2015   PLT 208 01/03/2015    Lab Results  Component Value Date   CREATININE 0.60 01/02/2015    No results found for: "PSA"  No results found for: "TESTOSTERONE"  No results found for: "HGBA1C"  Urinalysis    Component Value Date/Time   APPEARANCEUR Clear 08/27/2022 0931   GLUCOSEU Negative 08/27/2022 0931   BILIRUBINUR Negative 08/27/2022 0931   PROTEINUR Negative 08/27/2022 0931   UROBILINOGEN 0.2 08/29/2019 0927   NITRITE Negative 08/27/2022 0931   LEUKOCYTESUR Negative 08/27/2022 0931    Lab Results  Component Value Date   LABMICR Comment 08/27/2022   WBCUA 6-10 (A) 07/11/2020   LABEPIT >10 (A) 07/11/2020   MUCUS Present 06/18/2020   BACTERIA Moderate (A) 07/11/2020    Pertinent Imaging: Renal US  08/08/2023: Images reviewed and discussed with the patient  Results for orders placed during the hospital encounter of 07/25/20  Abdomen 1 view (KUB)  Narrative CLINICAL DATA:  Nephrolithiasis  EXAM: ABDOMEN - 1 VIEW  COMPARISON:  July 11, 2020  FINDINGS: Calcification in the right pelvis is stable and likely represents calcified uterine leiomyoma. No other abnormal calcification evident. There is moderate stool in the colon. There is no bowel dilatation or air-fluid level to suggest bowel obstruction. No free air. There is lumbar levoscoliosis.  IMPRESSION: Probable calcified uterine leiomyoma in right pelvis, stable. No other abnormal calcifications. No bowel obstruction or free air. Moderate stool in colon.   Electronically Signed By: Mordecai Applebaum III M.D. On: 07/27/2020 09:29  No results found for this or any previous visit.  No results found for this or any previous visit.  No results found for this or any previous visit.  Results for orders placed during the hospital encounter of  08/08/23  Ultrasound renal complete  Narrative CLINICAL DATA:  Follow-up nephrolithiasis.  EXAM: RENAL / URINARY TRACT ULTRASOUND COMPLETE  COMPARISON:  August 16, 2022  FINDINGS: Right Kidney:  Renal measurements: 11.3 x 4.9 x 5.8 cm = volume: 168.8 mL. Echogenicity within normal limits. No mass or hydronephrosis visualized.  Left Kidney:  Renal measurements: 10.3 x 5.1 x 5.6 cm = volume: 155.2 mL. Echogenicity within normal limits. No hydronephrosis visualized. There is a 3 x 2.4 x 2.5 cm simple cyst in the upper pole left kidney. No follow-up is recommended.  Bladder:  Appears normal for degree of bladder distention. Bilateral ureteral jets are noted.  Other:  None.  IMPRESSION: No acute abnormality identified. No hydronephrosis bilaterally.   Electronically Signed By: Anna Barnes M.D. On: 08/08/2023 10:09  No results found for this or any previous visit.  No results found for this or any previous visit.  No results found for this or any previous visit.   Assessment & Plan:  1. Nephrolithiasis (Primary) -dietary handout given - - Urinalysis, Routine w reflex microscopic   No follow-ups on file.  Johnie Nailer, MD  [facility] [place]

## 2024-09-13 ENCOUNTER — Other Ambulatory Visit (HOSPITAL_COMMUNITY)

## 2024-09-21 ENCOUNTER — Ambulatory Visit: Admitting: Urology
